# Patient Record
Sex: Female | Born: 1955 | Race: White | Hispanic: No | Marital: Married | Smoking: Never smoker
Health system: Southern US, Community
[De-identification: ages and names within clinical notes are randomized; demographics above are authoritative.]

## PROBLEM LIST (undated history)

## (undated) DIAGNOSIS — D649 Anemia, unspecified: Secondary | ICD-10-CM

## (undated) DIAGNOSIS — Z8619 Personal history of other infectious and parasitic diseases: Secondary | ICD-10-CM

## (undated) DIAGNOSIS — A389 Scarlet fever, uncomplicated: Secondary | ICD-10-CM

## (undated) DIAGNOSIS — N390 Urinary tract infection, site not specified: Secondary | ICD-10-CM

## (undated) DIAGNOSIS — N200 Calculus of kidney: Secondary | ICD-10-CM

## (undated) HISTORY — PX: COLONOSCOPY: SHX174

## (undated) HISTORY — DX: Calculus of kidney: N20.0

## (undated) HISTORY — DX: Personal history of other infectious and parasitic diseases: Z86.19

## (undated) HISTORY — DX: Anemia, unspecified: D64.9

## (undated) HISTORY — PX: TONSILLECTOMY: SUR1361

## (undated) HISTORY — PX: OTHER SURGICAL HISTORY: SHX169

## (undated) HISTORY — DX: Scarlet fever, uncomplicated: A38.9

## (undated) HISTORY — DX: Urinary tract infection, site not specified: N39.0

---

## 1997-10-08 ENCOUNTER — Ambulatory Visit (HOSPITAL_COMMUNITY): Admission: RE | Admit: 1997-10-08 | Discharge: 1997-10-08 | Payer: Self-pay | Admitting: Internal Medicine

## 1997-11-08 ENCOUNTER — Other Ambulatory Visit: Admission: RE | Admit: 1997-11-08 | Discharge: 1997-11-08 | Payer: Self-pay | Admitting: Obstetrics and Gynecology

## 1999-05-25 ENCOUNTER — Other Ambulatory Visit: Admission: RE | Admit: 1999-05-25 | Discharge: 1999-05-25 | Payer: Self-pay | Admitting: Obstetrics and Gynecology

## 2000-03-03 ENCOUNTER — Emergency Department (HOSPITAL_COMMUNITY): Admission: EM | Admit: 2000-03-03 | Discharge: 2000-03-04 | Payer: Self-pay | Admitting: Emergency Medicine

## 2000-03-08 ENCOUNTER — Emergency Department (HOSPITAL_COMMUNITY): Admission: EM | Admit: 2000-03-08 | Discharge: 2000-03-08 | Payer: Self-pay | Admitting: *Deleted

## 2000-05-24 ENCOUNTER — Other Ambulatory Visit: Admission: RE | Admit: 2000-05-24 | Discharge: 2000-05-24 | Payer: Self-pay | Admitting: Obstetrics and Gynecology

## 2001-06-02 ENCOUNTER — Other Ambulatory Visit: Admission: RE | Admit: 2001-06-02 | Discharge: 2001-06-02 | Payer: Self-pay | Admitting: Obstetrics and Gynecology

## 2002-03-20 ENCOUNTER — Encounter (INDEPENDENT_AMBULATORY_CARE_PROVIDER_SITE_OTHER): Payer: Self-pay | Admitting: *Deleted

## 2002-03-20 ENCOUNTER — Ambulatory Visit (HOSPITAL_COMMUNITY): Admission: RE | Admit: 2002-03-20 | Discharge: 2002-03-20 | Payer: Self-pay | Admitting: Obstetrics and Gynecology

## 2003-02-04 ENCOUNTER — Other Ambulatory Visit: Admission: RE | Admit: 2003-02-04 | Discharge: 2003-02-04 | Payer: Self-pay | Admitting: Obstetrics and Gynecology

## 2004-02-17 ENCOUNTER — Other Ambulatory Visit: Admission: RE | Admit: 2004-02-17 | Discharge: 2004-02-17 | Payer: Self-pay | Admitting: Obstetrics and Gynecology

## 2005-04-12 ENCOUNTER — Ambulatory Visit: Payer: Self-pay | Admitting: Internal Medicine

## 2005-04-12 ENCOUNTER — Ambulatory Visit: Payer: Self-pay | Admitting: Cardiology

## 2005-10-23 ENCOUNTER — Ambulatory Visit: Payer: Self-pay | Admitting: Internal Medicine

## 2006-10-25 ENCOUNTER — Encounter: Payer: Self-pay | Admitting: Internal Medicine

## 2006-11-05 ENCOUNTER — Ambulatory Visit: Payer: Self-pay | Admitting: Internal Medicine

## 2006-11-05 LAB — CONVERTED CEMR LAB
ALT: 22 units/L (ref 0–35)
AST: 24 units/L (ref 0–37)
Basophils Relative: 0.7 % (ref 0.0–1.0)
CO2: 32 meq/L (ref 19–32)
Calcium: 9.7 mg/dL (ref 8.4–10.5)
Chloride: 104 meq/L (ref 96–112)
Creatinine, Ser: 0.7 mg/dL (ref 0.4–1.2)
Crystals: NEGATIVE
GFR calc Af Amer: 114 mL/min
Glucose, Bld: 90 mg/dL (ref 70–99)
HCT: 40.6 % (ref 36.0–46.0)
Hemoglobin: 14.2 g/dL (ref 12.0–15.0)
Lymphocytes Relative: 31.3 % (ref 12.0–46.0)
MCV: 92.4 fL (ref 78.0–100.0)
Monocytes Absolute: 0.5 10*3/uL (ref 0.2–0.7)
Monocytes Relative: 8 % (ref 3.0–11.0)
Neutro Abs: 3.9 10*3/uL (ref 1.4–7.7)
Nitrite: NEGATIVE
Platelets: 324 10*3/uL (ref 150–400)
Potassium: 4.2 meq/L (ref 3.5–5.1)
RBC: 4.39 M/uL (ref 3.87–5.11)
RDW: 12.1 % (ref 11.5–14.6)
Sodium: 142 meq/L (ref 135–145)
Total Bilirubin: 0.8 mg/dL (ref 0.3–1.2)
Total CHOL/HDL Ratio: 3.1
Total Protein: 7.1 g/dL (ref 6.0–8.3)
Triglycerides: 63 mg/dL (ref 0–149)
Urine Glucose: NEGATIVE mg/dL
Urobilinogen, UA: 0.2 (ref 0.0–1.0)
VLDL: 13 mg/dL (ref 0–40)

## 2006-11-06 ENCOUNTER — Encounter: Payer: Self-pay | Admitting: Internal Medicine

## 2006-11-06 ENCOUNTER — Ambulatory Visit: Payer: Self-pay | Admitting: Internal Medicine

## 2006-11-25 ENCOUNTER — Ambulatory Visit: Payer: Self-pay | Admitting: Gastroenterology

## 2006-12-09 ENCOUNTER — Ambulatory Visit: Payer: Self-pay | Admitting: Gastroenterology

## 2006-12-09 ENCOUNTER — Encounter: Payer: Self-pay | Admitting: Internal Medicine

## 2007-03-21 ENCOUNTER — Encounter: Payer: Self-pay | Admitting: *Deleted

## 2007-03-21 DIAGNOSIS — Z9089 Acquired absence of other organs: Secondary | ICD-10-CM | POA: Insufficient documentation

## 2007-03-21 DIAGNOSIS — M26609 Unspecified temporomandibular joint disorder, unspecified side: Secondary | ICD-10-CM | POA: Insufficient documentation

## 2007-03-21 DIAGNOSIS — A389 Scarlet fever, uncomplicated: Secondary | ICD-10-CM | POA: Insufficient documentation

## 2007-03-21 DIAGNOSIS — G510 Bell's palsy: Secondary | ICD-10-CM | POA: Insufficient documentation

## 2007-03-21 DIAGNOSIS — D649 Anemia, unspecified: Secondary | ICD-10-CM | POA: Insufficient documentation

## 2007-03-21 HISTORY — DX: Anemia, unspecified: D64.9

## 2007-03-21 HISTORY — DX: Scarlet fever, uncomplicated: A38.9

## 2007-04-30 ENCOUNTER — Encounter: Admission: RE | Admit: 2007-04-30 | Discharge: 2007-04-30 | Payer: Self-pay | Admitting: Obstetrics and Gynecology

## 2008-05-10 ENCOUNTER — Encounter: Admission: RE | Admit: 2008-05-10 | Discharge: 2008-05-10 | Payer: Self-pay | Admitting: Obstetrics and Gynecology

## 2009-05-11 ENCOUNTER — Encounter: Admission: RE | Admit: 2009-05-11 | Discharge: 2009-05-11 | Payer: Self-pay | Admitting: Obstetrics and Gynecology

## 2009-07-22 ENCOUNTER — Ambulatory Visit: Payer: Self-pay | Admitting: Internal Medicine

## 2009-07-22 DIAGNOSIS — R030 Elevated blood-pressure reading, without diagnosis of hypertension: Secondary | ICD-10-CM | POA: Insufficient documentation

## 2009-08-03 ENCOUNTER — Telehealth (INDEPENDENT_AMBULATORY_CARE_PROVIDER_SITE_OTHER): Payer: Self-pay | Admitting: *Deleted

## 2009-08-04 ENCOUNTER — Telehealth (INDEPENDENT_AMBULATORY_CARE_PROVIDER_SITE_OTHER): Payer: Self-pay | Admitting: *Deleted

## 2009-08-08 ENCOUNTER — Encounter: Payer: Self-pay | Admitting: Internal Medicine

## 2009-08-08 ENCOUNTER — Encounter: Payer: Self-pay | Admitting: Cardiology

## 2009-08-08 ENCOUNTER — Ambulatory Visit: Payer: Self-pay | Admitting: Cardiology

## 2009-08-08 ENCOUNTER — Ambulatory Visit: Payer: Self-pay

## 2009-08-08 ENCOUNTER — Encounter (HOSPITAL_COMMUNITY)
Admission: RE | Admit: 2009-08-08 | Discharge: 2009-10-05 | Payer: Self-pay | Source: Home / Self Care | Admitting: Internal Medicine

## 2009-08-18 ENCOUNTER — Telehealth: Payer: Self-pay | Admitting: Internal Medicine

## 2009-08-23 ENCOUNTER — Telehealth: Payer: Self-pay | Admitting: Internal Medicine

## 2009-08-23 ENCOUNTER — Ambulatory Visit: Payer: Self-pay | Admitting: Internal Medicine

## 2009-09-20 ENCOUNTER — Ambulatory Visit: Payer: Self-pay | Admitting: Internal Medicine

## 2009-09-20 LAB — CONVERTED CEMR LAB
AST: 21 units/L (ref 0–37)
Albumin: 4.4 g/dL (ref 3.5–5.2)
BUN: 20 mg/dL (ref 6–23)
Basophils Relative: 0.6 % (ref 0.0–3.0)
CO2: 30 meq/L (ref 19–32)
Chloride: 106 meq/L (ref 96–112)
Cholesterol: 241 mg/dL — ABNORMAL HIGH (ref 0–200)
Direct LDL: 146.9 mg/dL
Eosinophils Relative: 2.6 % (ref 0.0–5.0)
GFR calc non Af Amer: 115.52 mL/min (ref >60)
Glucose, Bld: 83 mg/dL (ref 70–99)
HCT: 43.4 % (ref 36.0–46.0)
Lymphocytes Relative: 36.9 % (ref 12.0–46.0)
Lymphs Abs: 2.1 10*3/uL (ref 0.7–4.0)
Neutro Abs: 3 10*3/uL (ref 1.4–7.7)
Neutrophils Relative %: 51.1 % (ref 43.0–77.0)
Platelets: 254 10*3/uL (ref 150.0–400.0)
RBC: 4.56 M/uL (ref 3.87–5.11)
Total Bilirubin: 0.8 mg/dL (ref 0.3–1.2)
Total CHOL/HDL Ratio: 3
Triglycerides: 103 mg/dL (ref 0.0–149.0)

## 2009-09-22 ENCOUNTER — Ambulatory Visit: Payer: Self-pay | Admitting: Internal Medicine

## 2009-11-08 ENCOUNTER — Encounter: Payer: Self-pay | Admitting: Internal Medicine

## 2010-02-14 ENCOUNTER — Encounter: Payer: Self-pay | Admitting: Internal Medicine

## 2010-03-02 NOTE — Letter (Signed)
Summary: [facility]   Imported By: Sherian Rein 02/23/2010 10:49:30  _____________________________________________________________________  External Attachment:    Type:   Image     Comment:   External Document

## 2010-03-02 NOTE — Letter (Signed)
Summary: [facility]  [facility]   Imported By: Sherian Rein 11/22/2009 14:07:03  _____________________________________________________________________  External Attachment:    Type:   Image     Comment:   External Document

## 2010-03-02 NOTE — Assessment & Plan Note (Signed)
Summary: bp check/SD   Nurse Visit   Vital Signs:  Patient profile:   55 year old female BP sitting:   124 / 80  (left arm)  Vitals Entered By: Lamar Sprinkles, CMA (August 23, 2009 4:20 PM) CC: Pt concerned about elevated BP at local pharmacy today.  Comments Normal Bp in office today. Patient c/o tightness in her head this am and went to the local pharmacy. Symptoms are gone now. BP 155/89 at local pharmacy. Advised pt to continue to check BP daily and call office if symptoms returned. .....Marland KitchenMarland KitchenLamar Sprinkles, CMA  August 23, 2009 4:26 PM    Allergies: No Known Drug Allergies  Orders Added: 1)  Est. Patient Level I [16109]

## 2010-03-02 NOTE — Progress Notes (Signed)
Summary: Nuclear Pre-Procedure  Phone Note Outgoing Call Call back at [facility] Phone [phone]   Call placed by: Stanton Kidney, EMT-P,  August 04, 2009 11:05 AM Action Taken: Phone Call Completed Summary of Call: Left message with information on Myoview Information Sheet (see scanned document for details).     Nuclear Med Background Indications for Stress Test: Evaluation for Ischemia     Symptoms: Chest Pain, Chest Pressure    Nuclear Pre-Procedure Cardiac Risk Factors: Hypertension Height (in): 65

## 2010-03-02 NOTE — Progress Notes (Signed)
Summary: RESULTS  Phone Note Call from Patient Call back at [phone]   Summary of Call: Patient is requesting results of stress test.  Initial call taken by: Lamar Sprinkles, CMA,  August 18, 2009 1:55 PM  Follow-up for Phone Call        Normal stress test.  Follow-up by: Jacques Navy MD,  August 18, 2009 4:26 PM  Additional Follow-up for Phone Call Additional follow up Details #1::        Pt informed  Additional Follow-up by: Lamar Sprinkles, CMA,  August 18, 2009 4:45 PM

## 2010-03-02 NOTE — Assessment & Plan Note (Signed)
Summary: cpx/bcbs/#/cd   Vital Signs:  Patient profile:   55 year old female Height:      65 inches Weight:      140 pounds BMI:     23.38 O2 Sat:      97 % on Room air Temp:     98.1 degrees F oral Pulse rate:   79 / minute BP sitting:   120 / 72  (left arm) Cuff size:   regular  Vitals Entered By: Bill Salinas CMA (September 22, 2009 2:26 PM)  O2 Flow:  Room air CC: pt here for cpx/ ab  Vision Screening:      Vision Comments: Last eye exam was Aug 2011 with Dr Doylene Canard. Normal eye exam   Primary Care Provider:  Norins  CC:  pt here for cpx/ ab.  History of Present Illness: Patient presents for routine medical evaluation.She is current with gyn and mammography. She had a colonoscopy at 12 which was normal. She did have some GERD type symptoms and she did come to [facility] due to family history for risk - negative study. She is feeling good.   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 TONSILLECTOMY, HX OF (ICD-V45.79)     G4P4  Family History: Last updated: 2006-12-01 Father- died of sepsis, HTN, tobacco abuse, Lung cancer, esophageal cancer, liver cancer. 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, walker, good social support  Risk Factors: Alcohol Use: <1 (Dec 01, 2006) Caffeine Use: 3 (Dec 01, 2006)  Risk Factors: Smoking Status: never (12-01-2006) Passive Smoke Exposure: no (12/01/06)  Review of Systems  The patient denies anorexia, fever, weight loss, weight gain, vision loss, decreased hearing, hoarseness, chest pain, syncope, dyspnea on exertion, peripheral edema, prolonged cough, headaches, abdominal pain, severe indigestion/heartburn, incontinence, muscle weakness, suspicious skin lesions, difficulty walking, depression, unusual weight change, abnormal bleeding, enlarged lymph nodes, angioedema, and breast masses.    Physical Exam  General:  WNWD white female in no distress Head:  Normocephalic and atraumatic without obvious abnormalities. No apparent alopecia or balding. Eyes:  No corneal or conjunctival inflammation noted. EOMI. Perrla. Funduscopic exam benign, without hemorrhages, exudates or papilledema. Vision grossly normal. Ears:  External ear exam shows no significant lesions or  deformities.  Otoscopic examination reveals clear canals, tympanic membranes are intact bilaterally without bulging, retraction, inflammation or discharge. Hearing is grossly normal bilaterally. Nose:  no external deformity and no external erythema.   Mouth:  Oral mucosa and oropharynx without lesions or exudates.  Teeth in good repair. Neck:  supple, full ROM, no thyromegaly, and no carotid bruits.   Chest Wall:  No deformities, masses, or tenderness noted. Breasts:  deferred to gyn Lungs:  Normal respiratory effort, chest expands symmetrically. Lungs are clear to auscultation, no crackles or wheezes. Heart:  Normal rate and regular rhythm. S1 and S2 normal without gallop, murmur, click, rub or other extra sounds. Abdomen:  soft, non-tender, normal bowel sounds, no guarding, and no hepatomegaly.     Genitalia:  deferred to gyn Msk:  normal ROM, no joint swelling, and no redness over joints.   Pulses:  2+ radial and DP pulses Extremities:  No clubbing, cyanosis, edema, or deformity noted with normal full range of motion of all joints.   Neurologic:  alert & oriented X3, cranial nerves II-XII intact, strength normal in all extremities, gait normal, and DTRs symmetrical and normal.   Skin:  turgor normal, color normal, no rashes, and no suspicious lesions.   Cervical Nodes:  no anterior cervical adenopathy and no posterior cervical adenopathy.   Psych:  Oriented X3, memory intact for recent and remote, normally interactive, good eye contact, and not anxious appearing.     Impression & Recommendations:  Problem # 1:  Preventive Health Care (ICD-V70.0) Unremarkable history since last visit. Symptoms of dyspepsia have resolved. Had a negative nuclear stress test. Physical exam is normal and she is current with her gynecologist. She is current with mammography. She had a normal colonoscopy at age 47. Her labs are excellent except for mild elevation in LDL cholesterol at 146, but below NCEP threshold for medical treatment. She is to exercise greater descretion in her diet, i.e. low fat.  In summary - a delightful woman who is medically stable and healthy. She is advised to return for general physical exam with lab work in 3 years. Follow-up lipid panel in 6-12 months is suggested. She will otherwise return sooner as needed.   Patient: Bridget Elliott Note: All result statuses are Final unless otherwise noted.  Tests: (1) BMP (METABOL)   Sodium                    144 mEq/L                   135-145   Potassium                 4.7 mEq/L                   3.5-5.1   Chloride                  106 mEq/L                   96-112   Carbon Dioxide            30 mEq/L                    19-32   Glucose                   83 mg/dL                    13-08  BUN                       20 mg/dL                     1-61   Creatinine                0.6 mg/dL                   0.9-6.0   Calcium                   9.5 mg/dL                   4.5-40.9   GFR                       115.52 mL/min               >60  Tests: (2) Lipid Panel (LIPID)   Cholesterol          [H]  241 mg/dL                   8-119     ATP III Classification            Desirable:  < 200 mg/dL                    Borderline High:  200 - 239 mg/dL               High:  > = 240 mg/dL   Triglycerides             103.0 mg/dL                 1.4-782.9     Normal:  <150 mg/dL     Borderline High:  562 - 199 mg/dL   HDL                       13.08 mg/dL                 >65.78   VLDL Cholesterol          20.6 mg/dL                  4.6-96.2  CHO/HDL Ratio:  CHD Risk                             3                    Men          Women     1/2 Average Risk     3.4          3.3     Average Risk          5.0          4.4     2X Average Risk          9.6          7.1     3X Average Risk          15.0          11.0  Tests: (3) CBC Platelet w/Diff (CBCD)   White Cell Count          5.8 K/uL                    4.5-10.5   Red Cell Count            4.56 Mil/uL                 3.87-5.11   Hemoglobin                14.7 g/dL                   16.1-09.6   Hematocrit                43.4 %                      36.0-46.0   MCV                       95.1 fl                     78.0-100.0   MCHC                      33.8 g/dL                   04.5-40.9   RDW                       13.8 %                      11.5-14.6   Platelet Count            254.0 K/uL                  150.0-400.0   Neutrophil %              51.1 %                      43.0-77.0   Lymphocyte %              36.9 %                      12.0-46.0   Monocyte %                8.8 %                       3.0-12.0   Eosinophils%              2.6 %                       0.0-5.0   Basophils %               0.6 %                       0.0-3.0   Neutrophill  Absolute      3.0 K/uL                    1.4-7.7   Lymphocyte Absolute       2.1 K/uL  0.7-4.0   Monocyte Absolute         0.5 K/uL                    0.1-1.0  Eosinophils, Absolute                             0.1 K/uL                    0.0-0.7   Basophils Absolute        0.0 K/uL                    0.0-0.1  Tests: (4) Hepatic/Liver Function Panel (HEPATIC)   Total Bilirubin           0.8 mg/dL                   1.6-1.0   Direct Bilirubin          0.2 mg/dL                   9.6-0.4   Alkaline Phosphatase      72 U/L                      39-117   AST                       21 U/L                      0-37   ALT                       23 U/L                      0-35   Total Protein             6.8 g/dL                    5.4-0.9   Albumin                   4.4 g/dL                    8.1-1.9  Tests: (5) TSH (TSH)   FastTSH                   1.40 uIU/mL                 0.35-5.50  Tests: (6) Cholesterol LDL - Direct (DIRLDL)  Cholesterol LDL - Direct                             146.9 mg/dL     Optimal:  <147 mg/dL     Near or Above Optimal:  100-129 mg/dL     Borderline High:  829-562 mg/dL     High:  130-865 mg/dL     Very High:  >784 mg/dL  Preventive Care Screening  Mammogram:    Date:  04/29/2008    Results:  normal   Pap Smear:    Date:  04/29/2008    Results:  normal

## 2010-03-02 NOTE — Progress Notes (Signed)
----   Converted from flag ---- ---- 07/28/2009 4:36 PM, Edman Circle wrote: appt 7/11 @ 9:00 NPO 4 hours no smoking or caffeine 12 hours  ---- 07/28/2009 4:27 PM, Dagoberto Reef wrote: Thanks   ---- 07/22/2009 2:03 PM, Jacques Navy MD wrote: The following orders have been entered for this patient and placed on Admin Hold:  Type:     Referral       Code:   Cardiolite Description:   Cardiolite Order Date:   07/22/2009   Authorized By:   Jacques Navy MD Order #:   [id number] Clinical Notes:   Special Instructions: typical substernal pressure discomfort; positive family history - mother with heart disease early; post-menoapausal; new HTN ------------------------------

## 2010-03-02 NOTE — Assessment & Plan Note (Signed)
Summary: headaches x1wk/nausea/cd   Vital Signs:  Patient profile:   55 year old female Height:      65 inches Weight:      139 pounds BMI:     23.21 O2 Sat:      97 % on Room air Temp:     97.6 degrees F oral Pulse rate:   76 / minute BP sitting:   136 / 98  (left arm) Cuff size:   regular  Vitals Entered By: Bill Salinas CMA (July 22, 2009 1:29 PM)  O2 Flow:  Room air CC: pt here with c/o headaches x 1 week,  she has also been having lower abd pain and chest discomfort with belching/ ab   Primary Care Provider:  Norins  CC:  pt here with c/o headaches x 1 week and she has also been having lower abd pain and chest discomfort with belching/ ab.  History of Present Illness: "Feeling like crap." Headache, substernal pressure like discomfort, supra-pubic pressure discomfort. She saw Dr. Marcelle Overlie 1 month aqo - negative U/A, negative sonogram.  She reports that she has substernal pressure like discomfort that is not exertionally related. She has no radiation of pain, no diaphoresis, +/- dyspnea on exertion.  Current Medications (verified): 1)  Flonase 50 Mcg/act  Susp (Fluticasone Propionate) .... As Needed 2)  Claritin 10 Mg  Tabs (Loratadine) .... 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 TONSILLECTOMY, HX OF (ICD-V45.79)     G4P4  Family History: Last updated: 11/13/06 Father- died of  sepsis, HTN, tobacco abuse, Lung cancer, esophageal cancer, liver cancer. 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  The patient denies anorexia, fever, weight loss, weight gain, decreased hearing, hoarseness, syncope, prolonged cough, severe indigestion/heartburn, muscle weakness, difficulty walking, depression, abnormal bleeding, enlarged lymph nodes, and angioedema.    Physical Exam  General:  WNWD white female in no distress Head:  Normocephalic and atraumatic without  obvious abnormalities. No apparent alopecia or balding. Eyes:  C&S clear Lungs:  normal respiratory effort and normal breath sounds.   Heart:  normal rate, regular rhythm, no murmur, no gallop, and no JVD.   Abdomen:  soft, non-tender, normal bowel sounds, no distention, no guarding, no rigidity, and no hepatomegaly.   Msk:  normal ROM and no joint tenderness.   Pulses:  2+ radial pulses Neurologic:  alert & oriented X3, cranial nerves II-XII intact, and gait normal.   Skin:  turgor normal and color normal.   Psych:  Oriented X3, memory intact for recent and remote, normally interactive, and good eye contact.     Impression & Recommendations:  Problem # 1:  CHEST PAIN, SUBSTERNAL (ICD-786.59) Patient with recurrent substernal chest pressure. Risk factors: post-menopausal, HTN, postiive family history - mother with heart disease early.  Plan -  risk stratification - cardiolite study to r/o CAD.            start PPI therapy for possible GI etiology with plan to taper to H2 blocker therapy.  Orders: Cardiolite (Cardiolite)  Problem # 2:  ELEVATED BP READING WITHOUT DX HYPERTENSION (ICD-796.2) BP high today. No symptoms except recent h/o HA  Plan - monitor BP at home. Call for SBP running greater than 130s, DBP 90 or above.  Problem # 3:  TENSION HEADACHE (ICD-307.81) Patient describes several episodes of fronto-occiptal vice-like headaches that lasted several hours. She denies any associated symptoms, i.e. N/V, photophobia, paresthesia, prodrome. Picture c/w tension headache.  PLan - use of aleve as needed.   Complete Medication List: 1)  Flonase 50 Mcg/act Susp (Fluticasone propionate) .... As needed 2)  Claritin 10 Mg Tabs (Loratadine) .... Take one tablet daily as needed  Patient Instructions: 1)  substernal chest pressure - it is important to rule out cardiac disease first. Plan - schedule for a cardiac stress test. Other cause - increased acidity and irritation or reflux. Plan - PPI therapy, aciphex every AM for 9 days - if this makes a difference go to Prilosec OTC 2 capsules every AM for a full month of treatment. Then, switch to H2 blocker therapy (zantac,pepcid, etc - get the generic) daily for another week or two and then as needed.  2)  Blood pressure  136/98 today. Check BP at random. If systolic is high 161'W or above, if diastolic is 90 or above let me know.

## 2010-03-02 NOTE — Progress Notes (Signed)
  Phone Note Call from Patient   Summary of Call: Pt checked bp at pharmacy. BP today 155/89. She was advised to call office if this happened.  Initial call taken by: Lamar Sprinkles, CMA,  August 23, 2009 12:10 PM  Follow-up for Phone Call        Spoke w/pt. She c/o "feeling bad" today, slight head pressure, No pain. Scheduled for nurse visit for BP med Follow-up by: Lamar Sprinkles, CMA,  August 23, 2009 1:30 PM     Appended Document:  N/v for BP check not med

## 2010-03-02 NOTE — Assessment & Plan Note (Signed)
Summary: Cardiology Nuclear Study  Nuclear Med Background Indications for Stress Test: Evaluation for Ischemia     Symptoms: Chest Pressure, Chest Tightness    Nuclear Pre-Procedure Cardiac Risk Factors: Hypertension Caffeine/Decaff Intake: None NPO After: 9:30 PM Lungs: lungs IV 0.9% NS with Angio Cath: 22g     IV Site: (R) lower forearm IV Started by: Irean Hong RN Chest Size (in) 34     Cup Size D     Height (in): 65 Weight (lb): 137 BMI: 22.88  Nuclear Med Study 1 or 2 day study:  1 day     Stress Test Type:  Stress Reading MD:  Marca Ancona, MD     Referring MD:  Judie Petit.Norins Resting Radionuclide:  Technetium 80m Tetrofosmin     Resting Radionuclide Dose:  11 mCi  Stress Radionuclide:  Technetium 59m Tetrofosmin     Stress Radionuclide Dose:  33 mCi   Stress Protocol Exercise Time (min):  8:30 min     Max HR:  192 bpm     Predicted Max HR:  167 bpm  Max Systolic BP: 177 mm Hg     Percent Max HR:  114.97 %     METS: 10.10 Rate Pressure Product:  45409    Stress Test Technologist:  Milana Na EMT-P     Nuclear Technologist:  Domenic Polite CNMT  Rest Procedure  Myocardial perfusion imaging was performed at rest 45 minutes following the intravenous administration of Myoview Technetium 49m Tetrofosmin.  Stress Procedure  The patient exercised for 8:30. The patient stopped due to fatigue and denied any chest pain.  There were no significant ST-T wave changes.  Myoview was injected at peak exercise and myocardial perfusion imaging was performed after a brief delay.  QPS Raw Data Images:  Normal; no motion artifact; normal heart/lung ratio. Stress Images:  NI: Uniform and normal uptake of tracer in all myocardial segments. Rest Images:  Normal homogeneous uptake in all areas of the myocardium. Subtraction (SDS):  There is no evidence of scar or ischemia. Transient Ischemic Dilatation:  1.04  (Normal <1.22)  Lung/Heart Ratio:  .29  (Normal <0.45)  Quantitative  Gated Spect Images QGS EDV:  71 ml QGS ESV:  27 ml QGS EF:  62 % QGS cine images:  Normal wall motion.    Overall Impression  Exercise Capacity: Good exercise capacity. BP Response: Normal blood pressure response. Clinical Symptoms: Fatigue, no chest pain.  ECG Impression: Insignificant upsloping ST segment depression. Overall Impression: Normal stress nuclear study.

## 2010-03-06 ENCOUNTER — Other Ambulatory Visit: Payer: Self-pay

## 2010-03-07 ENCOUNTER — Encounter: Payer: Self-pay | Admitting: Internal Medicine

## 2010-04-06 ENCOUNTER — Telehealth: Payer: Self-pay | Admitting: Internal Medicine

## 2010-04-06 ENCOUNTER — Ambulatory Visit (INDEPENDENT_AMBULATORY_CARE_PROVIDER_SITE_OTHER): Payer: BC Managed Care – PPO | Admitting: Internal Medicine

## 2010-04-06 ENCOUNTER — Encounter: Payer: Self-pay | Admitting: Internal Medicine

## 2010-04-06 DIAGNOSIS — H8309 Labyrinthitis, unspecified ear: Secondary | ICD-10-CM

## 2010-04-06 NOTE — Letter (Signed)
Summary: [facility]   Imported By: Sherian Rein 03/30/2010 10:54:45  _____________________________________________________________________  External Attachment:    Type:   Image     Comment:   External Document

## 2010-04-07 ENCOUNTER — Telehealth: Payer: Self-pay | Admitting: Internal Medicine

## 2010-04-11 NOTE — Assessment & Plan Note (Signed)
Summary: DIZZINESS/  4:15 APPT /NWS   Vital Signs:  Patient profile:   55 year old female Height:      65 inches Weight:      140 pounds BMI:     23.38 O2 Sat:      98 % on Room air Temp:     97.9 degrees F oral Pulse rate:   83 / minute BP sitting:   158 / 98  (left arm) Cuff size:   regular  Vitals Entered By: Bill Salinas CMA (April 06, 2010 5:12 PM)  O2 Flow:  Room air  Primary Care Provider:  Igor Bishop   History of Present Illness: Bridget Elliott presents acutely for dizziness with nausea and vomiting.  She reports that she felt a little ill last night. This morning she awoke and the bed was spinning and she was very dizzy. she was able to get up and about but felt very off balance. Through the day she was mostly bedbound due to her symptoms. Through the day she felt worse but clearly describes feeling off balance. she denies double vision, focal weakness or paresthesia, headache, dificulty with speech or thinking or other neurolgoic symptoms. She has had periods of feeling cold anc clammy. during the exam she developed tingling in the hands and feet and a shakiness that is similar to a post-operative feeling.   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 TONSILLECTOMY, HX OF (ICD-V45.79)     G4P4  Family History: Last updated: 11-24-06 Father- died of sepsis, HTN, tobacco abuse,  Lung cancer, esophageal cancer, liver cancer. 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  Physical Exam  General:  WNWD white woman who is clammy to touch Head:  normocephalic and atraumatic.   Eyes:  vision grossly intact, pupils equal, pupils round, pupils reactive to light, corneas and lenses clear, no iris abnormalities, and no optic disk abnormalities.  She does have very subtle horizontal nystagmus Ears:  External ear  exam shows no significant lesions or deformities.  Otoscopic examination reveals clear canals, tympanic membranes are intact bilaterally without bulging, retraction, inflammation or discharge. Hearing is grossly normal bilaterally. Neck:  supple and full ROM.   Lungs:  normal respiratory effort and normal breath sounds.   Heart:  normal rate, regular rhythm, no murmur, and no JVD.   Abdomen:  soft, non-tender, and normal bowel sounds.   Msk:  normal ROM, no joint tenderness, no joint swelling, and no joint warmth.   Pulses:  2+ radial Extremities:  No clubbing, cyanosis, edema, or deformity noted with normal full range of motion of all joints.   Neurologic:  alert & oriented X3, cranial nerves II-XII intact, strength normal in all extremities, and DTRs symmetrical and normal.   Skin:  turgor normal.  Pale and clammy Cervical Nodes:  no anterior cervical adenopathy and no posterior cervical adenopathy.     Psych:  anxious, worried.   Impression & Recommendations:  Problem # 1:  LABYRINTHITIS, ACUTE (ICD-386.30) syptoms and exam c/w acute labyrinthitis with N/V and anxiety with vaso-vagal symptoms. No findings to suggest CNS event.  Plan - meclizine 25mg  q6 until symptoms better then 12.6 q 6 as needed           valium 5 mg q 6 x 2 doses then q 6 as needed           educated to signs and symptoms of CNS event with instructions to go to [facility] for Code Stroke should such develop.  Complete Medication List: 1)  Diazepam 5 Mg Tabs (Diazepam) .Marland Kitchen.. 1 by mouth every 6 hours x 2 then every 6 hours as needed for anxiety with dizziness  Patient Instructions: 1)  Severe dizziness - most likely inner ear, labyrinthitis, which gives dysequilibirium - like being seasick. No signs or findings to suggest a stroke-like event. Plan - meclizine 25mg  every 6 hours hours until symptoms are better then take 1/2 tablet every 6 hours as needed; valium 5 mg every 6 hours for 2 or 3 doses then as needed. 2)  For any major change in symptoms: double vision, isolated weakness, loss of feeling, change in thinking - go to [facility] ED as tell them you are there for a code stroke.  Prescriptions: DIAZEPAM 5 MG TABS (DIAZEPAM) 1 by mouth every 6 hours x 2 then every 6 hours as needed for anxiety with dizziness  #25 x 1   Entered and Authorized by:   Jacques Navy MD   Signed by:   Jacques Navy MD on 04/06/2010   Method used:   Telephoned to ...       [facility]  [phone]* (retail)       [address] [place], [place]  [address]       Ph: [phone] or [phone]       Fax: [phone]   RxID:   [id number]    Orders Added: 1)  Est. Patient Level IV [02542]

## 2010-04-11 NOTE — Progress Notes (Signed)
Summary: WORK IN APT?   Phone Note Call from Patient   Summary of Call: Pt woke up this am w/dizzyness when she moves her head. Patient is requesting work in apt today w/MD. Initial call taken by: Lamar Sprinkles, CMA,  April 06, 2010 2:53 PM  Follow-up for Phone Call        seen today Follow-up by: Jacques Navy MD,  April 06, 2010 6:23 PM

## 2010-04-11 NOTE — Progress Notes (Signed)
  Phone Note Call from Patient   Summary of Call: pt states she feels much better than yesterday. She has been in the bed all day but states overall she feels much better. She has had no nausea or vomitting. FYI Initial call taken by: Ami Bullins CMA,  April 07, 2010 4:49 PM  Follow-up for Phone Call        thanks Follow-up by: Jacques Navy MD,  April 07, 2010 6:05 PM

## 2010-05-10 ENCOUNTER — Other Ambulatory Visit: Payer: Self-pay | Admitting: Obstetrics and Gynecology

## 2010-05-10 DIAGNOSIS — Z1231 Encounter for screening mammogram for malignant neoplasm of breast: Secondary | ICD-10-CM

## 2010-05-18 ENCOUNTER — Ambulatory Visit
Admission: RE | Admit: 2010-05-18 | Discharge: 2010-05-18 | Disposition: A | Discharge status: Home / Self Care | Payer: BC Managed Care – PPO | Source: Ambulatory Visit | Attending: Obstetrics and Gynecology | Admitting: Obstetrics and Gynecology

## 2010-05-18 DIAGNOSIS — Z1231 Encounter for screening mammogram for malignant neoplasm of breast: Secondary | ICD-10-CM

## 2010-06-16 NOTE — H&P (Signed)
   NAME:  Bridget Elliott, Bridget Elliott                          ACCOUNT NO.:  [id number]   MEDICAL RECORD NO.:  [id number]                   PATIENT TYPE:  AMB   LOCATION:                                       FACILITY:  [facility]   PHYSICIAN:  Duke Salvia. Marcelle Overlie, M.D.            DATE OF BIRTH:  05-16-1955   DATE OF ADMISSION:  03/20/2002  DATE OF DISCHARGE:                                HISTORY & PHYSICAL   CHIEF COMPLAINT:  Abnormal uterine bleeding, small leiomyoma.   HISTORY OF PRESENT ILLNESS:  A 55 year old G-4, P-4.  Her husband has had a  vasectomy.  She has continued to have problems with heavy bleeding.  A  recent histogram in our office showed a normal cavity with uterus 10 x 6 x  4.7 with two small fibroids that were intramural or serosal in location.  She now presents now for a D&C, hysteroscopy, and cryoablation.  This  procedure, including the risks of bleeding, infection, and other  complications which may require open and additional surgery were all  discussed.  The 80% hypomenorrhea rate and 40% amenorrhea rate post  cryosurgery were reviewed with her which she understands and accepts.   ALLERGIES:  None.   REVIEW OF SYSTEMS:  Significant for anemia.  Her latest hemoglobin was 11.1.  That was on iron.   PAST SURGICAL HISTORY:  None.   CURRENT MEDICATIONS:  Vitamins and iron only.   OBSTETRICAL HISTORY:  Four vaginal deliveries at term without complication.   PHYSICAL EXAMINATION:  VITAL SIGNS: Temperature 98.2, blood pressure 120/72.  HEENT: Unremarkable.  NECK: Supple without masses.  CARDIOVASCULAR: Regular rate and rhythm without murmurs, rubs, or gallops.  BREASTS: Without masses.  ABDOMEN: Soft, flat, and nontender.  PELVIC: Normal external genitalia.  Vagina clear.  Uterus is mid to  posterior.  Normal signs, adnexa negative.  EXTREMITIES: Unremarkable.  NEUROLOGIC: Unremarkable.   IMPRESSION:  1. Small leiomyoma, normal sono histogram.  2. Menorrhagia.   PLAN:   D&C, hysteroscopy, cryoablation.  The procedure and risks reviewed as  above.                                              Richard M. Marcelle Overlie, M.D.     RMH/MEDQ  D:  03/20/2002  T:  03/20/2002  Job:  [id number]

## 2010-06-16 NOTE — Assessment & Plan Note (Signed)
Bridget Elliott                             PRIMARY CARE OFFICE NOTE   Bridget Elliott, Bridget Elliott                    MRN:          [id number]  DATE:10/23/2005                            DOB:          11-Nov-1955    HISTORY:  Bridget Elliott is a 55 year old woman followed for general health  care who does have a history of Bell's palsy, anemia and temporomandibular  joint syndrome.  She was last seen April 12, 2005 because of sudden onset of  heavy substernal chest pressure, like a basketball, with some dyspnea,  cough.  The patient at that time was sent for urgent CT scan to rule out a  pulmonary embolus, which was a normal study.   The patient presents today reporting that at her dentist's office she was  noted to have significant elevation in her systolic blood pressure.  This is  unusual for her.  In addition, she has had a bifrontal headache which she  describes as dull and pressure-like.  She will have occasional discomfort in  the occipital region of her head.  She does get relief with ibuprofen for  this.   The patient reports she has had episodes of chest pressure intermittently  over the last two weeks but no shortness of breath, no nausea, vomiting,  diaphoresis.  She has had no decreased exercise tolerance.  These episodes  are random and last for approximately one to two minutes.  The patient  frankly denies any significant social stressors or anxiety.   MEDICATIONS:  1. Flonase p.r.n..  2. Claritin p.r.n.; otherwise no regular medications.   REVIEW OF SYSTEMS:  Negative for fever, sweats or chills. CARDIOVASCULAR:  Per history of present illness but no palpitations, no exertional  discomfort, no decreased exercise tolerance.  No respiratory or  gastrointestinal problems are noted.   LIMITED PHYSICAL EXAMINATION:  VITAL SIGNS:  Temperature 98.6, blood  pressure 132/85, pulse 79, weight 128.  GENERAL APPEARANCE:  A well-nourished woman in no  acute distress.  HEENT:  Normocephalic, atraumatic.  Patient had no palpable temporal artery  and no tenderness in the area of the temporal artery.  CHEST:  Was clear with no rales, wheezes or rhonchi.  CARDIOVASCULAR:  2+ radial pulse.  She had a regular rate and rhythm without  murmurs, rubs or gallops.  No further examination conducted.   ASSESSMENT/PLAN:  1. Hypertension.  The patient's blood pressure at 132/85 is in the pre      hypertensive range.  In reviewing the patient's chart, she generally      runs in the 120's/80's with her last visit April 12, 2005 at 124/87,      November 26, 2003 at 120/76.  The patient is to monitor her blood      pressures at home over the next week or two.  She is to notify me if      her systolic pressures run greater than 130 at which point I would      consider her a candidate for possible medical intervention.  2. Chest discomfort.  The patient's chest discomfort is very  atypical for      angina given that it is spontaneous, not related to exercise, brief in      duration, with no associated symptoms.  The patient's cardiac risk      factors are low with patient having weakly positive family history      with cardiac disease in women who are older.  She is not a smoker, not      diabetic, has normal lipid levels, exercises on a regular basis.  The      patient's last electrocardiogram from December 22, 2002 was normal and      her examination today was normal.  I feel this is non-cardiac      discomfort.  The patient is to pay close attention to her symptoms      keeping a diary. She is to note if there is any associated symptoms      such as shortness of breath, diaphoresis or radiation of discomfort.      She is to pay particular attention to whether she is having any      decreased exercise tolerance.  For any of these symptoms, the patient      is to notify me and she would then be referred for a nuclear stress      study.  3. Headache.   Patient with bitemporal headache very suggestive of muscle      tension-type headache.  The patient's neurological examination is      grossly nonfocal.  Patient is to continue to use nonsteroidal anti-      inflammatory medication for her headache and if they continue to      persist I would consider referral to either Dr. Clarisse Gouge or Dr. Meryl Crutch      for neurology/headache specialist consult.                                   Bridget Gess Norins, MD   MEN/MedQ  DD:  10/24/2005  DT:  10/25/2005  Job #:  [id number]   cc:   Bing Matter

## 2010-06-16 NOTE — Op Note (Signed)
   [name], Bridget Elliott                     ACCOUNT NO.:  [id number]   MEDICAL RECORD NO.:  [id number]                   PATIENT TYPE:  AMB   LOCATION:  [facility]                                  FACILITY:  [facility]   PHYSICIAN:  Duke Salvia. Marcelle Overlie, M.D.            DATE OF BIRTH:  July 04, 1955   DATE OF PROCEDURE:  03/20/2002  DATE OF DISCHARGE:                                 OPERATIVE REPORT   PREOPERATIVE DIAGNOSES:  1. Abnormal uterine bleeding.  2. Menorrhagia.   POSTOPERATIVE DIAGNOSES:  1. Abnormal uterine bleeding.  2. Menorrhagia.   PROCEDURES:  1. Dilatation and curettage hysteroscopy.  2. Cryoablation.   SURGEON:  Duke Salvia. Marcelle Overlie, M.D.   ANESTHESIA:  MAC plus paracervical block.   PROCEDURE AND FINDINGS:  The patient was taken to the operating room.  After  an adequate level of sedation was obtained, the patient's legs were placed  in the stirrups.  The perineum and vagina were prepped and draped with  Betadine.  The bladder was drained.  EUA carried out.  The uterus was noted  in the posterior, normal size.  Mobile adnexa, negative.  A speculum was  positioned.  The cervix grasped with a tenaculum.  A paracervical block  created by infiltrating it at 9 o'clock submucosally, 5-7 mL of 1% Xylocaine  on either side, negative for aspiration.  The uterus was then sounded to 8  and 9 cm, was progressively dilated to a 31-32 Pratt dilator.  The 7-mm  diagnostic hysteroscope with continuous flow was then inserted.  A moderate  amount of some excessive endometrium was noted.  D&C was carried out,  minimal tissue was removed.  This was sent to pathology.  The scope was  reinserted.  The cavity was noted to be clear.  There was no evidence of any  residual polyps or fibroids.  After this was noted, the cryo probe was  placed into the left cornua at the appropriate depth.  A six minute cryo  sequence was performed.  After this was followed for five minutes, it was  repositioned  in the right cornual area at the appropriate depth and a follow  up six minute cryo sequence was performed without complications.  She  tolerated this well, went to the recovery room in good condition.                                               Richard M. Marcelle Overlie, M.D.    RMH/MEDQ  D:  03/20/2002  T:  03/20/2002  Job:  [id number]

## 2011-04-20 ENCOUNTER — Other Ambulatory Visit: Payer: Self-pay | Admitting: Obstetrics and Gynecology

## 2011-04-20 DIAGNOSIS — Z1231 Encounter for screening mammogram for malignant neoplasm of breast: Secondary | ICD-10-CM

## 2011-06-07 ENCOUNTER — Ambulatory Visit
Admission: RE | Admit: 2011-06-07 | Discharge: 2011-06-07 | Disposition: A | Discharge status: Home / Self Care | Payer: BC Managed Care – PPO | Source: Ambulatory Visit | Attending: Obstetrics and Gynecology | Admitting: Obstetrics and Gynecology

## 2011-06-07 DIAGNOSIS — Z1231 Encounter for screening mammogram for malignant neoplasm of breast: Secondary | ICD-10-CM

## 2011-10-22 ENCOUNTER — Encounter: Payer: Self-pay | Admitting: Internal Medicine

## 2011-10-22 ENCOUNTER — Telehealth: Payer: Self-pay | Admitting: Internal Medicine

## 2011-10-22 ENCOUNTER — Ambulatory Visit (INDEPENDENT_AMBULATORY_CARE_PROVIDER_SITE_OTHER): Payer: BC Managed Care – PPO | Admitting: Internal Medicine

## 2011-10-22 VITALS — BP 112/80 | HR 91 | Temp 98.0°F | Resp 16 | Wt 139.0 lb

## 2011-10-22 DIAGNOSIS — N390 Urinary tract infection, site not specified: Secondary | ICD-10-CM

## 2011-10-22 HISTORY — DX: Urinary tract infection, site not specified: N39.0

## 2011-10-22 LAB — POCT URINALYSIS DIPSTICK
Bilirubin, UA: NEGATIVE
Ketones, UA: NEGATIVE
Protein, UA: NEGATIVE
Spec Grav, UA: <=1.005
pH, UA: 5

## 2011-10-22 MED ORDER — SULFAMETHOXAZOLE-TRIMETHOPRIM 800-160 MG PO TABS: 1.0000 | ORAL_TABLET | Freq: Two times a day (BID) | ORAL | Status: AC

## 2011-10-22 NOTE — Telephone Encounter (Signed)
Come by lab now for U/A- doesn't need to wait. Will call with results and treatment if needed. [phone]

## 2011-10-22 NOTE — Telephone Encounter (Signed)
Caller: Aariel/Patient; Patient Name: Bridget Elliott; PCP: Illene Regulus (Adults only); Best Callback Phone Number: [phone]  Started with urinary symptoms on Saturday 9/21 - frequency, urgency, burning when urinating and nausea.  Seeing blood in urine especially when wipes or urinates on white paper.  Afebrile.  Triaged in Urinary Symptoms and Bloody Urine - Disposition:  See Provider Within 4 Hours due to urinary symptoms and genital pain with urination.  Appointment not available - transferred to office.

## 2011-10-23 NOTE — Telephone Encounter (Signed)
Pt states that she made an appt for 3 PM 09/23 with MEN and was seen. Pt unhappy that she made several attempt to contact office for follow up and eventually told to make an appt although MEN said she she did not. I apologized for the slower than usual call back.

## 2011-10-23 NOTE — Progress Notes (Signed)
  Subjective:    Patient ID: Bridget Elliott, female    DOB: 1955/06/05, 56 y.o.   MRN: [id number]  HPI Bridget Elliott presents for evaluation of increased urinary frequency, dysuria, post-micturition bladder spasm and hematuria. She has had no fever, chills, flank pain, N/V  PMH, FamHx and SocHx reviewed for any changes and relevance.  Takes no medications   Review of Systems System review is negative for any constitutional, cardiac, pulmonary, GI or neuro symptoms or complaints other than as described in the HPI.     Objective:   Physical Exam Filed Vitals:   10/22/11 1601  BP: 112/80  Pulse: 91  Temp: 98 F (36.7 C)  Resp: 16   Gen'l- WNWD white woman in no distress HEENT - C&S clear Abd- no guarding no tenderness       Assessment & Plan:  UTI - classical symptoms for cystitis. UA dip positive for blood and LE  Plan - TMP/SMX bid x 5 days

## 2012-07-03 ENCOUNTER — Other Ambulatory Visit: Payer: Self-pay

## 2012-07-03 DIAGNOSIS — Z1231 Encounter for screening mammogram for malignant neoplasm of breast: Secondary | ICD-10-CM

## 2012-09-15 ENCOUNTER — Ambulatory Visit
Admission: RE | Admit: 2012-09-15 | Discharge: 2012-09-15 | Disposition: A | Discharge status: Home / Self Care | Payer: BC Managed Care – PPO | Source: Ambulatory Visit

## 2012-09-15 DIAGNOSIS — Z1231 Encounter for screening mammogram for malignant neoplasm of breast: Secondary | ICD-10-CM

## 2014-01-13 ENCOUNTER — Other Ambulatory Visit: Payer: Self-pay | Admitting: Dermatology

## 2014-01-20 ENCOUNTER — Other Ambulatory Visit: Payer: Self-pay

## 2014-01-20 DIAGNOSIS — Z1231 Encounter for screening mammogram for malignant neoplasm of breast: Secondary | ICD-10-CM

## 2014-03-02 ENCOUNTER — Telehealth: Payer: Self-pay | Admitting: Internal Medicine

## 2014-03-02 NOTE — Telephone Encounter (Signed)
Pt used to be a pt of dr Linda Hedges. Would like to know if you will accept her as a pt? Pt states her daughter sees you.

## 2014-03-11 NOTE — Telephone Encounter (Signed)
[place] but need 30 minute appt and new patient paper work filled out before visit  No data in the [employer]  .

## 2014-03-12 NOTE — Telephone Encounter (Signed)
Pt has been scheduled.  °

## 2014-03-16 ENCOUNTER — Ambulatory Visit
Admission: RE | Admit: 2014-03-16 | Discharge: 2014-03-16 | Disposition: A | Discharge status: Home / Self Care | Payer: BLUE CROSS/BLUE SHIELD | Source: Ambulatory Visit

## 2014-03-16 ENCOUNTER — Encounter (INDEPENDENT_AMBULATORY_CARE_PROVIDER_SITE_OTHER): Payer: Self-pay

## 2014-03-16 DIAGNOSIS — Z1231 Encounter for screening mammogram for malignant neoplasm of breast: Secondary | ICD-10-CM

## 2014-03-31 ENCOUNTER — Encounter: Payer: Self-pay | Admitting: Internal Medicine

## 2014-03-31 ENCOUNTER — Ambulatory Visit (INDEPENDENT_AMBULATORY_CARE_PROVIDER_SITE_OTHER): Payer: BLUE CROSS/BLUE SHIELD | Admitting: Internal Medicine

## 2014-03-31 VITALS — BP 148/90 | Temp 98.6°F | Ht 64.5 in | Wt 143.6 lb

## 2014-03-31 DIAGNOSIS — J328 Other chronic sinusitis: Secondary | ICD-10-CM

## 2014-03-31 DIAGNOSIS — IMO0001 Reserved for inherently not codable concepts without codable children: Secondary | ICD-10-CM

## 2014-03-31 DIAGNOSIS — R030 Elevated blood-pressure reading, without diagnosis of hypertension: Secondary | ICD-10-CM

## 2014-03-31 MED ORDER — LISINOPRIL 10 MG PO TABS: 10.0000 mg | ORAL_TABLET | Freq: Every day | ORAL | Status: DC

## 2014-03-31 NOTE — Progress Notes (Signed)
Pre visit review using our clinic review tool, if applicable. No additional management support is needed unless otherwise documented below in the visit note.  Chief Complaint  Patient presents with  . Establish Care    Complains of pressure in her head and dizziness.  Also has pressure in her ear.  Has had 3 nose bleeds in the last month out of her left nostril only.    HPI: Patient  Bridget Elliott  59 y.o.  Homemaker with BS degree comes in today for  New transfer care  Health Care visit  Last ov was dr Linda Hedges for uti 2.5 years ago and  No acitve notes in EHR.  Other care provders : dr Matthew Saras and dr Delman Cheadle derm  Co of headache congestion without fever and recent let bloody dc  . No vomiting diarrhea Jan 7 and episode  of dizziness in bed and nosebleed ok when sat up.  Remote hx of  Vertigo  and labrythitis  Went away with sleep .  In last weeks head is tight    decongestants  Mom: ht  Father lung cancer  decceased   Sibling    Sis on ht  meds .   Health Maintenance  Topic Date Due  . HIV Screening  07/29/1971  . TETANUS/TDAP  07/29/1975  . PAP SMEAR  04/30/2011  . INFLUENZA VACCINE  04/29/2014 (Originally 08/29/2013)  . MAMMOGRAM  03/16/2016  . COLONOSCOPY  12/08/2016   Health Maintenance Review LIFESTYLE:  Exercise:  yes Tobacco/ETS:no Alcohol: per day ocass Sleep:7 hours Drug use: no Colonoscopy: 2008 PAP: 5 15  MAMMO: 2 16    ROS:  GEN/ HEENT: No fever, significant weight changes sweats headaches vision problems hearing changes,  Except above  CV/ PULM; No chest pain shortness of breath cough, syncope,edema  change in exercise tolerance. GI /GU: No adominal pain, vomiting, change in bowel habits. No blood in the stool. No significant GU symptoms. SKIN/HEME: ,no acute skin rashes suspicious lesions or bleeding. No lymphadenopathy, nodules, masses.  NEURO/ PSYCH:  No neurologic signs such as weakness numbness. No depression anxiety. IMM/ Allergy: No unusual  infections.  Allergy .   REST of 12 system review negative except as per HPI   Past Medical History  Diagnosis Date  . Renal stone     hx of removal and lithotrypsy   . Hx of varicella     No past surgical history on file.  Family History  Problem Relation Age of Onset  . Cancer Mother   . Hypertension Mother   . Hyperlipidemia Mother   . Cancer Father     Lung,esophageal  . Heart attack Maternal Grandfather   . Heart attack Paternal Grandfather     History   Social History  . Marital Status: Married    Spouse Name: N/A  . Number of Children: N/A  . Years of Education: N/A   Social History Main Topics  . Smoking status: Never Smoker   . Smokeless tobacco: Not on file  . Alcohol Use: 0.0 oz/week    0 Standard drinks or equivalent per week     Comment: 5 glasses of wine a week  . Drug Use: No  . Sexual Activity:    Partners: Male   Other Topics Concern  . None   Social History Narrative   7 hours of sleep per night   Does not work outside the home   Lives alone with her husband   2 Cats in the  home   G4P4   LIFESTYLE:    Exercise:     Tobacco/ETS:no   Alcohol: per day  0 - 5 per week    Sugar beverages:   Sleep: 7 hours    Drug use: no       Outpatient Encounter Prescriptions as of 03/31/2014  Medication Sig  . lisinopril (PRINIVIL,ZESTRIL) 10 MG tablet Take 1 tablet (10 mg total) by mouth daily.    EXAM:  BP 148/90 mmHg  Temp(Src) 98.6 F (37 C) (Oral)  Ht 5' 4.5" (1.638 m)  Wt 143 lb 9.6 oz (65.137 kg)  BMI 24.28 kg/m2  Body mass index is 24.28 kg/(m^2). Physical Exam:  repeat bp 148 140 90  Vital signs reviewed IYM:EBRA is a well-developed well-nourished alert cooperative    who appearsr stated age in no acute distress.  HEENT: normocephalic atraumatic , Eyes: PERRL EOM's full, conjunctiva clear, Nares: paten,t no deformity  congested  nodischarge, Ears: no deformity EAC's clear TMs with normal landmarks. Mouth: clear OP, no lesions,  edema.  Moist mucous membranes. Dentition in adequate repair. NECK: supple without masses, thyromegaly or bruits. CHEST/PULM:  Clear to auscultation and percussion breath sounds equal no wheeze , rales or rhonchi. No chest wall deformities or tenderness. CV: PMI is nondisplaced, S1 S2 no gallops, murmurs, rubs. Peripheral pulses are full without delay.No JVD .  ABDOMEN: Bowel sounds normal nontender  No guard or rebound, no hepato splenomegal no CVA tenderness.   Extremtities:  No clubbing cyanosis or edema, no acute joint swelling or redness no focal atrophy NEURO:  Oriented x3, cranial nerves 3-12 appear to be intact, no obvious focal weakness,gait within normal limits no abnormal reflexes or asymmetrical SKIN: No acute rashes normal turgor, color, no bruising or petechiae. PSYCH: Oriented, good eye contact, no obvious depression anxiety, cognition and judgment appear normal. LN: no cervical l adenopathy  Lab Results  Component Value Date   WBC 5.8 09/20/2009   HGB 14.7 09/20/2009   HCT 43.4 09/20/2009   PLT 254.0 09/20/2009   GLUCOSE 83 09/20/2009   CHOL 241* 09/20/2009   TRIG 103.0 09/20/2009   HDL 77.60 09/20/2009   LDLDIRECT 146.9 09/20/2009   LDLCALC 118* 11/05/2006   ALT 23 09/20/2009   AST 21 09/20/2009   NA 144 09/20/2009   K 4.7 09/20/2009   CL 106 09/20/2009   CREATININE 0.6 09/20/2009   BUN 20 09/20/2009   CO2 30 09/20/2009   TSH 1.40 09/20/2009    ASSESSMENT AND PLAN:  Discussed the following assessment and plan:  Elevated BP  Other chronic sinusitis  Renal stone BP  very high today  Better on repeat ; need more info no hx of acute ht   Will monitor  Could be meds  Med to start if elevated at home  Patient Care Team: Burnis Medin, MD as PCP - General (Internal Medicine) Molli Posey, MD as Consulting Physician (Obstetrics and Gynecology) [facility] Opthalmology (Ophthalmology) Jari Pigg, MD as Consulting Physician (Dermatology) Patient  Instructions    Take blood pressure readings twice a day for 10 - 14 days and then periodically .To ensure below 140/90   Bring  then im with a monitor .     If up most days we may begin low dose BP medication.     FLONASE / nasacort  Saline nose spray  For sinuses . Tylenol if needed for pain  consideration of other rx for sinus inflammation.  Hypertension Hypertension, commonly called high blood pressure,  is when the force of blood pumping through your arteries is too strong. Your arteries are the blood vessels that carry blood from your heart throughout your body. A blood pressure reading consists of a higher number over a lower number, such as 110/72. The higher number (systolic) is the pressure inside your arteries when your heart pumps. The lower number (diastolic) is the pressure inside your arteries when your heart relaxes. Ideally you want your blood pressure below 120/80. Hypertension forces your heart to work harder to pump blood. Your arteries may become narrow or stiff. Having hypertension puts you at risk for heart disease, stroke, and other problems.  RISK FACTORS Some risk factors for high blood pressure are controllable. Others are not.  Risk factors you cannot control include:   Race. You may be at higher risk if you are African American.  Age. Risk increases with age.  Gender. Men are at higher risk than women before age 66 years. After age 44, women are at higher risk than men. Risk factors you can control include:  Not getting enough exercise or physical activity.  Being overweight.  Getting too much fat, sugar, calories, or salt in your diet.  Drinking too much alcohol. SIGNS AND SYMPTOMS Hypertension does not usually cause signs or symptoms. Extremely high blood pressure (hypertensive crisis) may cause headache, anxiety, shortness of breath, and nosebleed. DIAGNOSIS  To check if you have hypertension, your health care provider will measure your blood pressure  while you are seated, with your arm held at the level of your heart. It should be measured at least twice using the same arm. Certain conditions can cause a difference in blood pressure between your right and left arms. A blood pressure reading that is higher than normal on one occasion does not mean that you need treatment. If one blood pressure reading is high, ask your health care provider about having it checked again. TREATMENT  Treating high blood pressure includes making lifestyle changes and possibly taking medicine. Living a healthy lifestyle can help lower high blood pressure. You may need to change some of your habits. Lifestyle changes may include:  Following the DASH diet. This diet is high in fruits, vegetables, and whole grains. It is low in salt, red meat, and added sugars.  Getting at least 2 hours of brisk physical activity every week.  Losing weight if necessary.  Not smoking.  Limiting alcoholic beverages.  Learning ways to reduce stress. If lifestyle changes are not enough to get your blood pressure under control, your health care provider may prescribe medicine. You may need to take more than one. Work closely with your health care provider to understand the risks and benefits. HOME CARE INSTRUCTIONS  Have your blood pressure rechecked as directed by your health care provider.   Take medicines only as directed by your health care provider. Follow the directions carefully. Blood pressure medicines must be taken as prescribed. The medicine does not work as well when you skip doses. Skipping doses also puts you at risk for problems.   Do not smoke.   Monitor your blood pressure at home as directed by your health care provider. SEEK MEDICAL CARE IF:   You think you are having a reaction to medicines taken.  You have recurrent headaches or feel dizzy.  You have swelling in your ankles.  You have trouble with your vision. SEEK IMMEDIATE MEDICAL CARE IF:  You  develop a severe headache or confusion.  You have unusual weakness, numbness,  or feel faint.  You have severe chest or abdominal pain.  You vomit repeatedly.  You have trouble breathing. MAKE SURE YOU:   Understand these instructions.  Will watch your condition.  Will get help right away if you are not doing well or get worse. Document Released: 01/15/2005 Document Revised: 06/01/2013 Document Reviewed: 11/07/2012 [facility] Patient Information 2015 [place], [place]. This information is not intended to replace advice given to you by your health care provider. Make sure you discuss any questions you have with your health care provider.    How to Take Your Blood Pressure HOW DO I GET A BLOOD PRESSURE MACHINE?  You can buy an electronic home blood pressure machine at your local pharmacy. Insurance will sometimes cover the cost if you have a prescription.  Ask your doctor what type of machine is best for you. There are different machines for your arm and your wrist.  If you decide to buy a machine to check your blood pressure on your arm, first check the size of your arm so you can buy the right size cuff. To check the size of your arm:   Use a measuring tape that shows both inches and centimeters.   Wrap the measuring tape around the upper-middle part of your arm. You may need someone to help you measure.   Write down your arm measurement in both inches and centimeters.   To measure your blood pressure correctly, it is important to have the right size cuff.   If your arm is up to 13 inches (up to 34 centimeters), get an adult cuff size.  If your arm is 13 to 17 inches (35 to 44 centimeters), get a large adult cuff size.    If your arm is 17 to 20 inches (45 to 52 centimeters), get an adult thigh cuff.  WHAT DO THE NUMBERS MEAN?   There are two numbers that make up your blood pressure. For example: 120/80.  The first number (120 in our example) is called the "systolic  pressure." It is a measure of the pressure in your blood vessels when your heart is pumping blood.  The second number (80 in our example) is called the "diastolic pressure." It is a measure of the pressure in your blood vessels when your heart is resting between beats.  Your doctor will tell you what your blood pressure should be. WHAT SHOULD I DO BEFORE I CHECK MY BLOOD PRESSURE?   Try to rest or relax for at least 30 minutes before you check your blood pressure.  Do not smoke.  Do not have any drinks with caffeine, such as:  Soda.  Coffee.  Tea.  Check your blood pressure in a quiet room.  Sit down and stretch out your arm on a table. Keep your arm at about the level of your heart. Let your arm relax.  Make sure that your legs are not crossed. HOW DO I CHECK MY BLOOD PRESSURE?  Follow the directions that came with your machine.  Make sure you remove any tight-fighting clothing from your arm or wrist. Wrap the cuff around your upper arm or wrist. You should be able to fit a finger between the cuff and your arm. If you cannot fit a finger between the cuff and your arm, it is too tight and should be removed and rewrapped.  Some units require you to manually pump up the arm cuff.  Automatic units inflate the cuff when you press a button.  Cuff deflation  is automatic in both models.  After the cuff is inflated, the unit measures your blood pressure and pulse. The readings are shown on a monitor. Hold still and breathe normally while the cuff is inflated.  Getting a reading takes less than a minute.  Some models store readings in a memory. Some provide a printout of readings. If your machine does not store your readings, keep a written record.  Take readings with you to your next visit with your doctor. Document Released: 12/29/2007 Document Revised: 06/01/2013 Document Reviewed: 03/12/2013 [facility] Patient Information 2015 [place], [place]. This information is not intended to  replace advice given to you by your health care provider. Make sure you discuss any questions you have with your health care provider.    DASH Eating Plan DASH stands for "Dietary Approaches to Stop Hypertension." The DASH eating plan is a healthy eating plan that has been shown to reduce high blood pressure (hypertension). Additional health benefits may include reducing the risk of type 2 diabetes mellitus, heart disease, and stroke. The DASH eating plan may also help with weight loss. WHAT DO I NEED TO KNOW ABOUT THE DASH EATING PLAN? For the DASH eating plan, you will follow these general guidelines:  Choose foods with a percent daily value for sodium of less than 5% (as listed on the food label).  Use salt-free seasonings or herbs instead of table salt or sea salt.  Check with your health care provider or pharmacist before using salt substitutes.  Eat lower-sodium products, often labeled as "lower sodium" or "no salt added."  Eat fresh foods.  Eat more vegetables, fruits, and low-fat dairy products.  Choose whole grains. Look for the word "whole" as the first word in the ingredient list.  Choose fish and skinless chicken or [place] more often than red meat. Limit fish, poultry, and meat to 6 oz (170 g) each day.  Limit sweets, desserts, sugars, and sugary drinks.  Choose heart-healthy fats.  Limit cheese to 1 oz (28 g) per day.  Eat more home-cooked food and less restaurant, buffet, and fast food.  Limit fried foods.  Cook foods using methods other than frying.  Limit canned vegetables. If you do use them, rinse them well to decrease the sodium.  When eating at a restaurant, ask that your food be prepared with less salt, or no salt if possible. WHAT FOODS CAN I EAT? Seek help from a dietitian for individual calorie needs. Grains Whole grain or whole wheat bread. Brown rice. Whole grain or whole wheat pasta. Quinoa, bulgur, and whole grain cereals. Low-sodium cereals.  Corn or whole wheat flour tortillas. Whole grain cornbread. Whole grain crackers. Low-sodium crackers. Vegetables Fresh or frozen vegetables (raw, steamed, roasted, or grilled). Low-sodium or reduced-sodium tomato and vegetable juices. Low-sodium or reduced-sodium tomato sauce and paste. Low-sodium or reduced-sodium canned vegetables.  Fruits All fresh, canned (in natural juice), or frozen fruits. Meat and Other Protein Products Ground beef (85% or leaner), grass-fed beef, or beef trimmed of fat. Skinless chicken or [place]. Ground chicken or [place]. Pork trimmed of fat. All fish and seafood. Eggs. Dried beans, peas, or lentils. Unsalted nuts and seeds. Unsalted canned beans. Dairy Low-fat dairy products, such as skim or 1% milk, 2% or reduced-fat cheeses, low-fat ricotta or cottage cheese, or plain low-fat yogurt. Low-sodium or reduced-sodium cheeses. Fats and Oils Tub margarines without trans fats. Light or reduced-fat mayonnaise and salad dressings (reduced sodium). Avocado. Safflower, olive, or canola oils. Natural peanut or almond butter. Other  Unsalted popcorn and pretzels. The items listed above may not be a complete list of recommended foods or beverages. Contact your dietitian for more options. WHAT FOODS ARE NOT RECOMMENDED? Grains White bread. White pasta. White rice. Refined cornbread. Bagels and croissants. Crackers that contain trans fat. Vegetables Creamed or fried vegetables. Vegetables in a cheese sauce. Regular canned vegetables. Regular canned tomato sauce and paste. Regular tomato and vegetable juices. Fruits Dried fruits. Canned fruit in light or heavy syrup. Fruit juice. Meat and Other Protein Products Fatty cuts of meat. Ribs, chicken wings, bacon, sausage, bologna, salami, chitterlings, fatback, hot dogs, bratwurst, and packaged luncheon meats. Salted nuts and seeds. Canned beans with salt. Dairy Whole or 2% milk, cream, half-and-half, and cream cheese. Whole-fat or  sweetened yogurt. Full-fat cheeses or blue cheese. Nondairy creamers and whipped toppings. Processed cheese, cheese spreads, or cheese curds. Condiments Onion and garlic salt, seasoned salt, table salt, and sea salt. Canned and packaged gravies. Worcestershire sauce. Tartar sauce. Barbecue sauce. Teriyaki sauce. Soy sauce, including reduced sodium. Steak sauce. Fish sauce. Oyster sauce. Cocktail sauce. Horseradish. Ketchup and mustard. Meat flavorings and tenderizers. Bouillon cubes. Hot sauce. Tabasco sauce. Marinades. Taco seasonings. Relishes. Fats and Oils Butter, stick margarine, lard, shortening, ghee, and bacon fat. Coconut, palm kernel, or palm oils. Regular salad dressings. Other Pickles and olives. Salted popcorn and pretzels. The items listed above may not be a complete list of foods and beverages to avoid. Contact your dietitian for more information. WHERE CAN I FIND MORE INFORMATION? National Heart, Lung, and Blood Institute: travelstabloid.com Document Released: 01/04/2011 Document Revised: 06/01/2013 Document Reviewed: 11/19/2012 [facility] Patient Information 2015 [place], [place]. This information is not intended to replace advice given to you by your health care provider. Make sure you discuss any questions you have with your health care provider.       Standley Brooking. Calea Hribar M.D.

## 2014-03-31 NOTE — Patient Instructions (Addendum)
Take blood pressure readings twice a day for 10 - 14 days and then periodically .To ensure below 140/90   Bring  then im with a monitor .     If up most days we may begin low dose BP medication.     FLONASE / nasacort  Saline nose spray  For sinuses . Tylenol if needed for pain  consideration of other rx for sinus inflammation.  Hypertension Hypertension, commonly called high blood pressure, is when the force of blood pumping through your arteries is too strong. Your arteries are the blood vessels that carry blood from your heart throughout your body. A blood pressure reading consists of a higher number over a lower number, such as 110/72. The higher number (systolic) is the pressure inside your arteries when your heart pumps. The lower number (diastolic) is the pressure inside your arteries when your heart relaxes. Ideally you want your blood pressure below 120/80. Hypertension forces your heart to work harder to pump blood. Your arteries may become narrow or stiff. Having hypertension puts you at risk for heart disease, stroke, and other problems.  RISK FACTORS Some risk factors for high blood pressure are controllable. Others are not.  Risk factors you cannot control include:   Race. You may be at higher risk if you are African American.  Age. Risk increases with age.  Gender. Men are at higher risk than women before age 76 years. After age 90, women are at higher risk than men. Risk factors you can control include:  Not getting enough exercise or physical activity.  Being overweight.  Getting too much fat, sugar, calories, or salt in your diet.  Drinking too much alcohol. SIGNS AND SYMPTOMS Hypertension does not usually cause signs or symptoms. Extremely high blood pressure (hypertensive crisis) may cause headache, anxiety, shortness of breath, and nosebleed. DIAGNOSIS  To check if you have hypertension, your health care provider will measure your blood pressure while you are  seated, with your arm held at the level of your heart. It should be measured at least twice using the same arm. Certain conditions can cause a difference in blood pressure between your right and left arms. A blood pressure reading that is higher than normal on one occasion does not mean that you need treatment. If one blood pressure reading is high, ask your health care provider about having it checked again. TREATMENT  Treating high blood pressure includes making lifestyle changes and possibly taking medicine. Living a healthy lifestyle can help lower high blood pressure. You may need to change some of your habits. Lifestyle changes may include:  Following the DASH diet. This diet is high in fruits, vegetables, and whole grains. It is low in salt, red meat, and added sugars.  Getting at least 2 hours of brisk physical activity every week.  Losing weight if necessary.  Not smoking.  Limiting alcoholic beverages.  Learning ways to reduce stress. If lifestyle changes are not enough to get your blood pressure under control, your health care provider may prescribe medicine. You may need to take more than one. Work closely with your health care provider to understand the risks and benefits. HOME CARE INSTRUCTIONS  Have your blood pressure rechecked as directed by your health care provider.   Take medicines only as directed by your health care provider. Follow the directions carefully. Blood pressure medicines must be taken as prescribed. The medicine does not work as well when you skip doses. Skipping doses also puts you at  risk for problems.   Do not smoke.   Monitor your blood pressure at home as directed by your health care provider. SEEK MEDICAL CARE IF:   You think you are having a reaction to medicines taken.  You have recurrent headaches or feel dizzy.  You have swelling in your ankles.  You have trouble with your vision. SEEK IMMEDIATE MEDICAL CARE IF:  You develop a  severe headache or confusion.  You have unusual weakness, numbness, or feel faint.  You have severe chest or abdominal pain.  You vomit repeatedly.  You have trouble breathing. MAKE SURE YOU:   Understand these instructions.  Will watch your condition.  Will get help right away if you are not doing well or get worse. Document Released: 01/15/2005 Document Revised: 06/01/2013 Document Reviewed: 11/07/2012 [facility] Patient Information 2015 [place], [place]. This information is not intended to replace advice given to you by your health care provider. Make sure you discuss any questions you have with your health care provider.    How to Take Your Blood Pressure HOW DO I GET A BLOOD PRESSURE MACHINE?  You can buy an electronic home blood pressure machine at your local pharmacy. Insurance will sometimes cover the cost if you have a prescription.  Ask your doctor what type of machine is best for you. There are different machines for your arm and your wrist.  If you decide to buy a machine to check your blood pressure on your arm, first check the size of your arm so you can buy the right size cuff. To check the size of your arm:   Use a measuring tape that shows both inches and centimeters.   Wrap the measuring tape around the upper-middle part of your arm. You may need someone to help you measure.   Write down your arm measurement in both inches and centimeters.   To measure your blood pressure correctly, it is important to have the right size cuff.   If your arm is up to 13 inches (up to 34 centimeters), get an adult cuff size.  If your arm is 13 to 17 inches (35 to 44 centimeters), get a large adult cuff size.    If your arm is 17 to 20 inches (45 to 52 centimeters), get an adult thigh cuff.  WHAT DO THE NUMBERS MEAN?   There are two numbers that make up your blood pressure. For example: 120/80.  The first number (120 in our example) is called the "systolic pressure."  It is a measure of the pressure in your blood vessels when your heart is pumping blood.  The second number (80 in our example) is called the "diastolic pressure." It is a measure of the pressure in your blood vessels when your heart is resting between beats.  Your doctor will tell you what your blood pressure should be. WHAT SHOULD I DO BEFORE I CHECK MY BLOOD PRESSURE?   Try to rest or relax for at least 30 minutes before you check your blood pressure.  Do not smoke.  Do not have any drinks with caffeine, such as:  Soda.  Coffee.  Tea.  Check your blood pressure in a quiet room.  Sit down and stretch out your arm on a table. Keep your arm at about the level of your heart. Let your arm relax.  Make sure that your legs are not crossed. HOW DO I CHECK MY BLOOD PRESSURE?  Follow the directions that came with your machine.  Make sure  you remove any tight-fighting clothing from your arm or wrist. Wrap the cuff around your upper arm or wrist. You should be able to fit a finger between the cuff and your arm. If you cannot fit a finger between the cuff and your arm, it is too tight and should be removed and rewrapped.  Some units require you to manually pump up the arm cuff.  Automatic units inflate the cuff when you press a button.  Cuff deflation is automatic in both models.  After the cuff is inflated, the unit measures your blood pressure and pulse. The readings are shown on a monitor. Hold still and breathe normally while the cuff is inflated.  Getting a reading takes less than a minute.  Some models store readings in a memory. Some provide a printout of readings. If your machine does not store your readings, keep a written record.  Take readings with you to your next visit with your doctor. Document Released: 12/29/2007 Document Revised: 06/01/2013 Document Reviewed: 03/12/2013 [facility] Patient Information 2015 [place], [place]. This information is not intended to replace  advice given to you by your health care provider. Make sure you discuss any questions you have with your health care provider.    DASH Eating Plan DASH stands for "Dietary Approaches to Stop Hypertension." The DASH eating plan is a healthy eating plan that has been shown to reduce high blood pressure (hypertension). Additional health benefits may include reducing the risk of type 2 diabetes mellitus, heart disease, and stroke. The DASH eating plan may also help with weight loss. WHAT DO I NEED TO KNOW ABOUT THE DASH EATING PLAN? For the DASH eating plan, you will follow these general guidelines:  Choose foods with a percent daily value for sodium of less than 5% (as listed on the food label).  Use salt-free seasonings or herbs instead of table salt or sea salt.  Check with your health care provider or pharmacist before using salt substitutes.  Eat lower-sodium products, often labeled as "lower sodium" or "no salt added."  Eat fresh foods.  Eat more vegetables, fruits, and low-fat dairy products.  Choose whole grains. Look for the word "whole" as the first word in the ingredient list.  Choose fish and skinless chicken or [place] more often than red meat. Limit fish, poultry, and meat to 6 oz (170 g) each day.  Limit sweets, desserts, sugars, and sugary drinks.  Choose heart-healthy fats.  Limit cheese to 1 oz (28 g) per day.  Eat more home-cooked food and less restaurant, buffet, and fast food.  Limit fried foods.  Cook foods using methods other than frying.  Limit canned vegetables. If you do use them, rinse them well to decrease the sodium.  When eating at a restaurant, ask that your food be prepared with less salt, or no salt if possible. WHAT FOODS CAN I EAT? Seek help from a dietitian for individual calorie needs. Grains Whole grain or whole wheat bread. Brown rice. Whole grain or whole wheat pasta. Quinoa, bulgur, and whole grain cereals. Low-sodium cereals. Corn or  whole wheat flour tortillas. Whole grain cornbread. Whole grain crackers. Low-sodium crackers. Vegetables Fresh or frozen vegetables (raw, steamed, roasted, or grilled). Low-sodium or reduced-sodium tomato and vegetable juices. Low-sodium or reduced-sodium tomato sauce and paste. Low-sodium or reduced-sodium canned vegetables.  Fruits All fresh, canned (in natural juice), or frozen fruits. Meat and Other Protein Products Ground beef (85% or leaner), grass-fed beef, or beef trimmed of fat. Skinless chicken or [place].  Ground chicken or [place]. Pork trimmed of fat. All fish and seafood. Eggs. Dried beans, peas, or lentils. Unsalted nuts and seeds. Unsalted canned beans. Dairy Low-fat dairy products, such as skim or 1% milk, 2% or reduced-fat cheeses, low-fat ricotta or cottage cheese, or plain low-fat yogurt. Low-sodium or reduced-sodium cheeses. Fats and Oils Tub margarines without trans fats. Light or reduced-fat mayonnaise and salad dressings (reduced sodium). Avocado. Safflower, olive, or canola oils. Natural peanut or almond butter. Other Unsalted popcorn and pretzels. The items listed above may not be a complete list of recommended foods or beverages. Contact your dietitian for more options. WHAT FOODS ARE NOT RECOMMENDED? Grains White bread. White pasta. White rice. Refined cornbread. Bagels and croissants. Crackers that contain trans fat. Vegetables Creamed or fried vegetables. Vegetables in a cheese sauce. Regular canned vegetables. Regular canned tomato sauce and paste. Regular tomato and vegetable juices. Fruits Dried fruits. Canned fruit in light or heavy syrup. Fruit juice. Meat and Other Protein Products Fatty cuts of meat. Ribs, chicken wings, bacon, sausage, bologna, salami, chitterlings, fatback, hot dogs, bratwurst, and packaged luncheon meats. Salted nuts and seeds. Canned beans with salt. Dairy Whole or 2% milk, cream, half-and-half, and cream cheese. Whole-fat or sweetened  yogurt. Full-fat cheeses or blue cheese. Nondairy creamers and whipped toppings. Processed cheese, cheese spreads, or cheese curds. Condiments Onion and garlic salt, seasoned salt, table salt, and sea salt. Canned and packaged gravies. Worcestershire sauce. Tartar sauce. Barbecue sauce. Teriyaki sauce. Soy sauce, including reduced sodium. Steak sauce. Fish sauce. Oyster sauce. Cocktail sauce. Horseradish. Ketchup and mustard. Meat flavorings and tenderizers. Bouillon cubes. Hot sauce. Tabasco sauce. Marinades. Taco seasonings. Relishes. Fats and Oils Butter, stick margarine, lard, shortening, ghee, and bacon fat. Coconut, palm kernel, or palm oils. Regular salad dressings. Other Pickles and olives. Salted popcorn and pretzels. The items listed above may not be a complete list of foods and beverages to avoid. Contact your dietitian for more information. WHERE CAN I FIND MORE INFORMATION? National [employer], [employer], [employer] Institute: travelstabloid.com Document Released: 01/04/2011 Document Revised: 06/01/2013 Document Reviewed: 11/19/2012 [facility] Patient Information 2015 [place], [place]. This information is not intended to replace advice given to you by your health care provider. Make sure you discuss any questions you have with your health care provider.

## 2014-04-08 DIAGNOSIS — N200 Calculus of kidney: Secondary | ICD-10-CM | POA: Insufficient documentation

## 2014-04-22 ENCOUNTER — Other Ambulatory Visit (INDEPENDENT_AMBULATORY_CARE_PROVIDER_SITE_OTHER): Payer: BLUE CROSS/BLUE SHIELD

## 2014-04-22 DIAGNOSIS — Z0000 Encounter for general adult medical examination without abnormal findings: Secondary | ICD-10-CM | POA: Diagnosis not present

## 2014-04-22 LAB — LIPID PANEL
CHOLESTEROL: 225 mg/dL — AB (ref 0–200)
HDL: 72.8 mg/dL (ref >39.00)
LDL Cholesterol: 140 mg/dL — ABNORMAL HIGH (ref 0–99)
NonHDL: 152.2
Total CHOL/HDL Ratio: 3
Triglycerides: 62 mg/dL (ref 0.0–149.0)
VLDL: 12.4 mg/dL (ref 0.0–40.0)

## 2014-04-22 LAB — CBC WITH DIFFERENTIAL/PLATELET
BASOS PCT: 0.6 % (ref 0.0–3.0)
Basophils Absolute: 0 10*3/uL (ref 0.0–0.1)
EOS PCT: 2.5 % (ref 0.0–5.0)
Eosinophils Absolute: 0.2 10*3/uL (ref 0.0–0.7)
HEMATOCRIT: 43.7 % (ref 36.0–46.0)
Hemoglobin: 14.9 g/dL (ref 12.0–15.0)
LYMPHS ABS: 2.7 10*3/uL (ref 0.7–4.0)
Lymphocytes Relative: 39.9 % (ref 12.0–46.0)
MCHC: 34.2 g/dL (ref 30.0–36.0)
MCV: 91.8 fl (ref 78.0–100.0)
MONO ABS: 0.7 10*3/uL (ref 0.1–1.0)
Monocytes Relative: 10.7 % (ref 3.0–12.0)
NEUTROS ABS: 3.1 10*3/uL (ref 1.4–7.7)
NEUTROS PCT: 46.3 % (ref 43.0–77.0)
Platelets: 325 10*3/uL (ref 150.0–400.0)
RBC: 4.76 Mil/uL (ref 3.87–5.11)
RDW: 13.9 % (ref 11.5–15.5)
WBC: 6.7 10*3/uL (ref 4.0–10.5)

## 2014-04-22 LAB — HEPATIC FUNCTION PANEL
ALBUMIN: 4.5 g/dL (ref 3.5–5.2)
ALT: 19 U/L (ref 0–35)
AST: 17 U/L (ref 0–37)
Alkaline Phosphatase: 88 U/L (ref 39–117)
Bilirubin, Direct: 0 mg/dL (ref 0.0–0.3)
TOTAL PROTEIN: 7.4 g/dL (ref 6.0–8.3)
Total Bilirubin: 0.4 mg/dL (ref 0.2–1.2)

## 2014-04-22 LAB — TSH: TSH: 2.15 u[IU]/mL (ref 0.35–4.50)

## 2014-04-22 LAB — BASIC METABOLIC PANEL
BUN: 21 mg/dL (ref 6–23)
CALCIUM: 10 mg/dL (ref 8.4–10.5)
CO2: 30 meq/L (ref 19–32)
CREATININE: 0.83 mg/dL (ref 0.40–1.20)
Chloride: 103 mEq/L (ref 96–112)
GFR: 75.12 mL/min (ref >60.00)
Glucose, Bld: 94 mg/dL (ref 70–99)
Potassium: 4.6 mEq/L (ref 3.5–5.1)
Sodium: 140 mEq/L (ref 135–145)

## 2014-04-29 ENCOUNTER — Ambulatory Visit (INDEPENDENT_AMBULATORY_CARE_PROVIDER_SITE_OTHER): Payer: BLUE CROSS/BLUE SHIELD | Admitting: Internal Medicine

## 2014-04-29 ENCOUNTER — Encounter: Payer: Self-pay | Admitting: Internal Medicine

## 2014-04-29 VITALS — BP 136/80 | Temp 97.8°F | Ht 64.5 in | Wt 144.8 lb

## 2014-04-29 DIAGNOSIS — R030 Elevated blood-pressure reading, without diagnosis of hypertension: Secondary | ICD-10-CM | POA: Diagnosis not present

## 2014-04-29 DIAGNOSIS — IMO0001 Reserved for inherently not codable concepts without codable children: Secondary | ICD-10-CM

## 2014-04-29 DIAGNOSIS — H8110 Benign paroxysmal vertigo, unspecified ear: Secondary | ICD-10-CM

## 2014-04-29 NOTE — Progress Notes (Signed)
Pre visit review using our clinic review tool, if applicable. No additional management support is needed unless otherwise documented below in the visit note. Chief Complaint  Patient presents with  . Follow-up    HPI: Bridget Elliott 59 y.o. comes in for fu of elevaetd bp  See last note new patient  Since that time. Has been monitoring bp and up and dow about 50% were up 140 150 rest good and a few  Below 110 has monitor . Didn't start the med   Hx of vertigo when going laying to  sitting and other wise ok  Off an on for a while ? bpv ROS: See pertinent positives and negatives per HPI.  Past Medical History  Diagnosis Date  . Renal stone     hx of removal and lithotrypsy   . Hx of varicella     Family History  Problem Relation Age of Onset  . Cancer Mother   . Hypertension Mother   . Hyperlipidemia Mother   . Cancer Father     Lung,esophageal  . Heart attack Maternal Grandfather   . Heart attack Paternal Grandfather     History   Social History  . Marital Status: Married    Spouse Name: N/A  . Number of Children: N/A  . Years of Education: N/A   Social History Main Topics  . Smoking status: Never Smoker   . Smokeless tobacco: Not on file  . Alcohol Use: 0.0 oz/week    0 Standard drinks or equivalent per week     Comment: 5 glasses of wine a week  . Drug Use: No  . Sexual Activity:    Partners: Male   Other Topics Concern  . None   Social History Narrative   7 hours of sleep per night   Does not work outside the home   Lives alone with her husband   2 Cats in the home   G4P4   LIFESTYLE:    Exercise:     Tobacco/ETS:no   Alcohol: per day  0 - 5 per week    Sugar beverages:   Sleep: 7 hours    Drug use: no       Outpatient Encounter Prescriptions as of 04/29/2014  Medication Sig  . lisinopril (PRINIVIL,ZESTRIL) 10 MG tablet Take 1 tablet (10 mg total) by mouth daily. (Patient not taking: Reported on 04/29/2014)    EXAM:  BP 136/80 mmHg   Temp(Src) 97.8 F (36.6 C) (Oral)  Ht 5' 4.5" (1.638 m)  Wt 144 lb 12.8 oz (65.681 kg)  BMI 24.48 kg/m2  Body mass index is 24.48 kg/(m^2).  GENERAL: vitals reviewed and listed above, alert, oriented, appears well hydrated and in no acute distress HEENT: atraumatic, conjunctiva  clear, no obvious abnormalities on inspection of external nose and ears MS: moves all extremities without noticeable focal  abnormality PSYCH: pleasant and cooperative, no obvious depression or anxiety Lab Results  Component Value Date   WBC 6.7 04/22/2014   HGB 14.9 04/22/2014   HCT 43.7 04/22/2014   PLT 325.0 04/22/2014   GLUCOSE 94 04/22/2014   CHOL 225* 04/22/2014   TRIG 62.0 04/22/2014   HDL 72.80 04/22/2014   LDLDIRECT 146.9 09/20/2009   LDLCALC 140* 04/22/2014   ALT 19 04/22/2014   AST 17 04/22/2014   NA 140 04/22/2014   K 4.6 04/22/2014   CL 103 04/22/2014   CREATININE 0.83 04/22/2014   BUN 21 04/22/2014   CO2 30 04/22/2014   TSH  2.15 04/22/2014  labs reviewed  BP Readings from Last 3 Encounters:  04/29/14 136/80  03/31/14 148/90  10/22/11 112/80   13 READSINGS     119/59  LEFT ARM  AND    108/75  Office  Machine 116/70 correlated both arms  ASSESSMENT AND PLAN:  Discussed the following assessment and plan:  Elevated BP - better today  cont lsi  no meds unless not controlled ,home monitor correlates  Benign paroxysmal positional vertigo, unspecified laterality - off an on for years?   pt referral vestibular rx - Plan: Ambulatory referral to Physical Therapy  -Patient advised to return or notify health care team  if symptoms worsen ,persist or new concerns arise.  Patient Instructions  Continue life style help  Because low  Your labs are good cholesterol could be better .  Continue  Monitoring   Without medicine for now. Can do pt referral for BPV. If BP  not controlled . Get back with [place].       Standley Brooking. Panosh M.D.

## 2014-04-29 NOTE — Patient Instructions (Addendum)
Continue life style help  Because low  Your labs are good cholesterol could be better .  Continue  Monitoring   Without medicine for now. Can do pt referral for BPV. If BP  not controlled . Get back with [place].

## 2014-07-06 ENCOUNTER — Other Ambulatory Visit: Payer: Self-pay | Admitting: Obstetrics and Gynecology

## 2014-07-07 LAB — CYTOLOGY - PAP

## 2016-02-28 ENCOUNTER — Other Ambulatory Visit: Payer: Self-pay | Admitting: Obstetrics and Gynecology

## 2016-02-28 DIAGNOSIS — Z1231 Encounter for screening mammogram for malignant neoplasm of breast: Secondary | ICD-10-CM

## 2016-04-02 ENCOUNTER — Ambulatory Visit
Admission: RE | Admit: 2016-04-02 | Discharge: 2016-04-02 | Disposition: A | Discharge status: Home / Self Care | Payer: BLUE CROSS/BLUE SHIELD | Source: Ambulatory Visit | Attending: Obstetrics and Gynecology | Admitting: Obstetrics and Gynecology

## 2016-04-02 DIAGNOSIS — Z1231 Encounter for screening mammogram for malignant neoplasm of breast: Secondary | ICD-10-CM

## 2016-06-14 ENCOUNTER — Other Ambulatory Visit: Payer: BLUE CROSS/BLUE SHIELD

## 2016-06-19 ENCOUNTER — Encounter: Payer: BLUE CROSS/BLUE SHIELD | Admitting: Internal Medicine

## 2016-09-03 NOTE — Progress Notes (Signed)
Chief Complaint  Patient presents with  . Annual Exam    HPI: Patient  Bridget Elliott  61 y.o. comes in today for Preventive Health Care visit  Last visit 3 16  bp not  Talking med so not taking and dentist said 120  Range  And ok .  Feeling well   hasn't checked recently   Health Maintenance  Topic Date Due  . Hepatitis C Screening  [birth year]  . TETANUS/TDAP  07/29/1975  . INFLUENZA VACCINE  08/29/2016  . HIV Screening  09/04/2017 (Originally 07/29/1970)  . COLONOSCOPY  12/08/2016  . PAP SMEAR  07/05/2017  . MAMMOGRAM  04/03/2018   Health Maintenance Review LIFESTYLE:  Exercise:  Walks  Every day  Tobacco/ETS: no Alcohol:  Summer  Wine travel.  3 per week.  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 GI /GU: No adominal pain, vomiting, change in bowel habits. No blood in the stool. No significant GU symptoms. SKIN/HEME: ,no acute skin rashes suspicious lesions or bleeding. No lymphadenopathy, nodules, masses.  NEURO/ PSYCH:  No neurologic signs such as weakness numbness. No depression anxiety. IMM/ Allergy: No unusual infections.  Allergy .   REST of 12 system review negative except as per HPI   Past Medical History:  Diagnosis Date  . ANEMIA 03/21/2007   Qualifier: History of  By: Danny Lawless CMA, [place]    . Hx of varicella   . 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 Scarlet fever 03/21/2007   Qualifier: History of  By: Danny Lawless CMA, [place]    . UTI (urinary tract infection) 10/22/2011    History reviewed. No pertinent surgical history.  Family History  Problem Relation Age of Onset  . Cancer Mother   . Hypertension Mother   . Hyperlipidemia Mother   . Cancer Father       Lung,esophageal  . Heart attack Maternal Grandfather   . Heart attack Paternal Grandfather     Social History   Social History  . Marital status: Married    Spouse name: N/A  . Number of children: N/A  . Years of education: N/A   Social History Main Topics  . Smoking status: Never Smoker  . Smokeless tobacco: Never Used  . Alcohol use 0.0 oz/week     Comment: 5 glasses of wine a week  . Drug use: No  . Sexual activity: Yes    Partners: Male   Other Topics Concern  . None   Social History Narrative   7 hours of sleep per night   Does not work outside the home   Lives alone with her husband   2 Cats in the home   G4P4   LIFESTYLE:    Exercise:     Tobacco/ETS:no   Alcohol: per day  0 - 5 per week    Sugar beverages:   Sleep: 7 hours    Drug use: no          EXAM:  BP 140/82 (BP Location: Right Arm, Patient Position: Sitting, Cuff Size: Normal)   Pulse 74   Temp 97.7 F (36.5 C) (Oral)   Ht 5'  4.75" (1.645 m)   Wt 138 lb 9.6 oz (62.9 kg)   BMI 23.24 kg/m   Body mass index is 23.24 kg/m. Wt Readings from Last 3 Encounters:  09/04/16 138 lb 9.6 oz (62.9 kg)  04/29/14 144 lb 12.8 oz (65.7 kg)  03/31/14 143 lb 9.6 oz (65.1 kg)    Physical Exam: Vital signs reviewed ZWC:HENI is a well-developed well-nourished alert cooperative    who appearsr stated age in no acute distress.  HEENT: normocephalic atraumatic , Eyes: PERRL EOM's full, conjunctiva clear, Nares: paten,t no deformity discharge or tenderness., Ears: no deformity EAC's clear TMs with normal landmarks. Mouth: clear OP, no lesions, edema.  Moist mucous membranes. Dentition in adequate repair. NECK: supple without masses, thyromegaly or bruits. CHEST/PULM:  Clear to auscultation and percussion breath sounds equal no wheeze , rales or rhonchi. No chest wall deformities or tenderness. Breast: normal by inspection . No dimpling, discharge, masses, tenderness or discharge . CV: PMI is nondisplaced,  S1 S2 no gallops, murmurs, rubs. Peripheral pulses are full without delay.No JVD .  ABDOMEN: Bowel sounds normal nontender  No guard or rebound, no hepato splenomegal no CVA tenderness.  No hernia. Extremtities:  No clubbing cyanosis or edema, no acute joint swelling or redness no focal atrophy NEURO:  Oriented x3, cranial nerves 3-12 appear to be intact, no obvious focal weakness,gait within normal limits no abnormal reflexes or asymmetrical SKIN: No acute rashes normal turgor, color, no bruising or petechiae. Sun changes  PSYCH: Oriented, good eye contact, no obvious depression anxiety, cognition and judgment appear normal. LN: no cervical axillary inguinal adenopathy    BP Readings from Last 3 Encounters:  09/04/16 140/82  04/29/14 136/80  03/31/14 (!) 148/90   Wt Readings from Last 3 Encounters:  09/04/16 138 lb 9.6 oz (62.9 kg)  04/29/14 144 lb 12.8 oz (65.7 kg)  03/31/14 143 lb 9.6 oz (65.1 kg)     Lab results reviewed with patient   ASSESSMENT AND PLAN:  Discussed the following assessment and plan:  Visit for preventive health examination - Plan: Basic metabolic panel, CBC with Differential/Platelet, Hepatic function panel, Lipid panel, TSH, Hepatitis C antibody  Hyperlipidemia, unspecified hyperlipidemia type - favoral ratio will reassess neg premature family hx vascular  - Plan: Basic metabolic panel, CBC with Differential/Platelet, Hepatic function panel, Lipid panel, TSH, Hepatitis C antibody  Elevated blood pressure reading - never started med got 120 readigns ast dentist get more readings to ensure at goal 120/80  send in mychart - Plan: Basic metabolic panel, CBC with Differential/Platelet, Hepatic function panel, Lipid panel, TSH, Hepatitis C antibody  Need for hepatitis C screening test - Plan: Hepatitis C antibody Hep c screen  Low risk  Disc bp goal  shingrix vaccine disc   Disc ?s about  crp and cac score usage .  Modifiable risk factors  At thjs time Patient  Care Team: Burnis Medin, MD as PCP - General (Internal Medicine) Molli Posey, MD as Consulting Physician (Obstetrics and Gynecology) [facility] (Ophthalmology) Jari Pigg, MD as Consulting Physician (Dermatology) Patient Instructions  Healthy lifestyle includes : At least 150 minutes of exercise weeks  , weight at healthy levels, which is usually   BMI 19-25. Avoid trans fats and processed foods;  Increase fresh fruits and veges to 5 servings per day. And avoid sweet beverages including tea and juice. Mediterranean diet with olive oil and nuts have been noted to be heart and brain healthy . Avoid tobacco products . Limit  alcohol to  7 per week for women and 14 servings for men.  Get adequate sleep . Wear seat belts . Don't text and drive .   Take blood pressure readings twice a day for 7- 10 days and then periodically   .Send in readings      BP goal is 120/80   Send in readings to decide on intervention.  After bp readings and lipid  Numbers can get you more information about your cardiovasuclar risk  In the next 10 years.    Preventive Care 40-64 Years, Female Preventive care refers to lifestyle choices and visits with your health care provider that can promote health and wellness. What does preventive care include?  A yearly physical exam. This is also called an annual well check.  Dental exams once or twice a year.  Routine eye exams. Ask your health care provider how often you should have your eyes checked.  Personal lifestyle choices, including: ? Daily care of your teeth and gums. ? Regular physical activity. ? Eating a healthy diet. ? Avoiding tobacco and drug use. ? Limiting alcohol use. ? Practicing safe sex. ? Taking low-dose aspirin daily starting at age 68. ? Taking vitamin and mineral supplements as recommended by your health care provider. What happens during an annual well check? The services and screenings done by your health care  provider during your annual well check will depend on your age, overall health, lifestyle risk factors, and family history of disease. Counseling Your health care provider may ask you questions about your:  Alcohol use.  Tobacco use.  Drug use.  Emotional well-being.  Home and relationship well-being.  Sexual activity.  Eating habits.  Work and work Statistician.  Method of birth control.  Menstrual cycle.  Pregnancy history.  Screening You may have the following tests or measurements:  Height, weight, and BMI.  Blood pressure.  Lipid and cholesterol levels. These may be checked every 5 years, or more frequently if you are over 22 years old.  Skin check.  Lung cancer screening. You may have this screening every year starting at age 13 if you have a 30-pack-year history of smoking and currently smoke or have quit within the past 15 years.  Fecal occult blood test (FOBT) of the stool. You may have this test every year starting at age 38.  Flexible sigmoidoscopy or colonoscopy. You may have a sigmoidoscopy every 5 years or a colonoscopy every 10 years starting at age 32.  Hepatitis C blood test.  Hepatitis B blood test.  Sexually transmitted disease (STD) testing.  Diabetes screening. This is done by checking your blood sugar (glucose) after you have not eaten for a while (fasting). You may have this done every 1-3 years.  Mammogram. This may be done every 1-2 years. Talk to your health care provider about when you should start having regular mammograms. This may depend on whether you have a family history of breast cancer.  BRCA-related cancer screening. This may be done if you have a family history of breast, ovarian, tubal, or peritoneal cancers.  Pelvic exam and Pap test. This may be done every 3 years starting at age 54. Starting at age 82, this may be done every 5 years if you have a Pap test in combination with an HPV test.  Bone density scan. This is done  to screen for osteoporosis. You may have this scan if you are at high risk for osteoporosis.  Discuss your test results, treatment options, and  if necessary, the need for more tests with your health care provider. Vaccines Your health care provider may recommend certain vaccines, such as:  Influenza vaccine. This is recommended every year.  Tetanus, diphtheria, and acellular pertussis (Tdap, Td) vaccine. You may need a Td booster every 10 years.  Varicella vaccine. You may need this if you have not been vaccinated.  Zoster vaccine. You may need this after age 57.  Measles, mumps, and rubella (MMR) vaccine. You may need at least one dose of MMR if you were born in 1957 or later. You may also need a second dose.  Pneumococcal 13-valent conjugate (PCV13) vaccine. You may need this if you have certain conditions and were not previously vaccinated.  Pneumococcal polysaccharide (PPSV23) vaccine. You may need one or two doses if you smoke cigarettes or if you have certain conditions.  Meningococcal vaccine. You may need this if you have certain conditions.  Hepatitis A vaccine. You may need this if you have certain conditions or if you travel or work in places where you may be exposed to hepatitis A.  Hepatitis B vaccine. You may need this if you have certain conditions or if you travel or work in places where you may be exposed to hepatitis B.  Haemophilus influenzae type b (Hib) vaccine. You may need this if you have certain conditions.  Talk to your health care provider about which screenings and vaccines you need and how often you need them. This information is not intended to replace advice given to you by your health care provider. Make sure you discuss any questions you have with your health care provider. Document Released: 02/11/2015 Document Revised: 10/05/2015 Document Reviewed: 11/16/2014 Elsevier Interactive Patient Education  2017 [place] K. Panosh  M.D. Lab Results  Component Value Date   WBC 6.4 09/04/2016   HGB 15.7 (H) 09/04/2016   HCT 47.4 (H) 09/04/2016   PLT 310.0 09/04/2016   GLUCOSE 98 09/04/2016   CHOL 236 (H) 09/04/2016   TRIG 69.0 09/04/2016   HDL 77.10 09/04/2016   LDLDIRECT 146.9 09/20/2009   LDLCALC 145 (H) 09/04/2016   ALT 15 09/04/2016   AST 13 09/04/2016   NA 141 09/04/2016   K 4.9 09/04/2016   CL 102 09/04/2016   CREATININE 0.73 09/04/2016   BUN 16 09/04/2016   CO2 31 09/04/2016   TSH 1.30 09/04/2016   The 10-year ASCVD risk score Mikey Bussing DC Jr., et al., 2013) is: 3.5%   Values used to calculate the score:     Age: 65 years     Sex: Female     Is Non-Hispanic African American: No     Diabetic: No     Tobacco smoker: No     Systolic Blood Pressure: 660 mmHg     Is BP treated: No     HDL Cholesterol: 77.1 mg/dL     Total Cholesterol: 236 mg/dL

## 2016-09-04 ENCOUNTER — Ambulatory Visit (INDEPENDENT_AMBULATORY_CARE_PROVIDER_SITE_OTHER): Payer: BLUE CROSS/BLUE SHIELD | Admitting: Internal Medicine

## 2016-09-04 ENCOUNTER — Other Ambulatory Visit: Payer: Self-pay | Admitting: Internal Medicine

## 2016-09-04 ENCOUNTER — Encounter: Payer: Self-pay | Admitting: Internal Medicine

## 2016-09-04 VITALS — BP 140/82 | HR 74 | Temp 97.7°F | Ht 64.75 in | Wt 138.6 lb

## 2016-09-04 DIAGNOSIS — E785 Hyperlipidemia, unspecified: Secondary | ICD-10-CM

## 2016-09-04 DIAGNOSIS — Z0001 Encounter for general adult medical examination with abnormal findings: Secondary | ICD-10-CM

## 2016-09-04 DIAGNOSIS — Z0000 Encounter for general adult medical examination without abnormal findings: Secondary | ICD-10-CM

## 2016-09-04 DIAGNOSIS — R030 Elevated blood-pressure reading, without diagnosis of hypertension: Secondary | ICD-10-CM

## 2016-09-04 DIAGNOSIS — Z1159 Encounter for screening for other viral diseases: Secondary | ICD-10-CM | POA: Diagnosis not present

## 2016-09-04 LAB — CBC WITH DIFFERENTIAL/PLATELET
Basophils Absolute: 0 10*3/uL (ref 0.0–0.1)
Basophils Relative: 0.7 % (ref 0.0–3.0)
EOS ABS: 0.1 10*3/uL (ref 0.0–0.7)
EOS PCT: 1.7 % (ref 0.0–5.0)
HCT: 47.4 % — ABNORMAL HIGH (ref 36.0–46.0)
HEMOGLOBIN: 15.7 g/dL — AB (ref 12.0–15.0)
Lymphocytes Relative: 33.8 % (ref 12.0–46.0)
Lymphs Abs: 2.2 10*3/uL (ref 0.7–4.0)
MCHC: 33.1 g/dL (ref 30.0–36.0)
MCV: 95.3 fl (ref 78.0–100.0)
MONO ABS: 0.5 10*3/uL (ref 0.1–1.0)
Monocytes Relative: 8.5 % (ref 3.0–12.0)
Neutro Abs: 3.5 10*3/uL (ref 1.4–7.7)
Neutrophils Relative %: 55.3 % (ref 43.0–77.0)
Platelets: 310 10*3/uL (ref 150.0–400.0)
RBC: 4.98 Mil/uL (ref 3.87–5.11)
RDW: 13.4 % (ref 11.5–15.5)
WBC: 6.4 10*3/uL (ref 4.0–10.5)

## 2016-09-04 LAB — BASIC METABOLIC PANEL
BUN: 16 mg/dL (ref 6–23)
CHLORIDE: 102 meq/L (ref 96–112)
CO2: 31 mEq/L (ref 19–32)
Calcium: 10.2 mg/dL (ref 8.4–10.5)
Creatinine, Ser: 0.73 mg/dL (ref 0.40–1.20)
GFR: 86.4 mL/min (ref >60.00)
Glucose, Bld: 98 mg/dL (ref 70–99)
POTASSIUM: 4.9 meq/L (ref 3.5–5.1)
Sodium: 141 mEq/L (ref 135–145)

## 2016-09-04 LAB — LIPID PANEL
CHOL/HDL RATIO: 3
Cholesterol: 236 mg/dL — ABNORMAL HIGH (ref 0–200)
HDL: 77.1 mg/dL (ref >39.00)
LDL CALC: 145 mg/dL — AB (ref 0–99)
NonHDL: 158.69
Triglycerides: 69 mg/dL (ref 0.0–149.0)
VLDL: 13.8 mg/dL (ref 0.0–40.0)

## 2016-09-04 LAB — HEPATIC FUNCTION PANEL
ALT: 15 U/L (ref 0–35)
AST: 13 U/L (ref 0–37)
Albumin: 4.9 g/dL (ref 3.5–5.2)
Alkaline Phosphatase: 84 U/L (ref 39–117)
BILIRUBIN DIRECT: 0.1 mg/dL (ref 0.0–0.3)
BILIRUBIN TOTAL: 0.7 mg/dL (ref 0.2–1.2)
TOTAL PROTEIN: 7.5 g/dL (ref 6.0–8.3)

## 2016-09-04 LAB — TSH: TSH: 1.3 u[IU]/mL (ref 0.35–4.50)

## 2016-09-04 NOTE — Patient Instructions (Signed)
Healthy lifestyle includes : At least 150 minutes of exercise weeks  , weight at healthy levels, which is usually   BMI 19-25. Avoid trans fats and processed foods;  Increase fresh fruits and veges to 5 servings per day. And avoid sweet beverages including tea and juice. Mediterranean diet with olive oil and nuts have been noted to be heart and brain healthy . Avoid tobacco products . Limit  alcohol to  7 per week for women and 14 servings for men.  Get adequate sleep . Wear seat belts . Don't text and drive .   Take blood pressure readings twice a day for 7- 10 days and then periodically   .Send in readings      BP goal is 120/80   Send in readings to decide on intervention.  After bp readings and lipid  Numbers can get you more information about your cardiovasuclar risk  In the next 10 years.    Preventive Care 40-64 Years, Female Preventive care refers to lifestyle choices and visits with your health care provider that can promote health and wellness. What does preventive care include?  A yearly physical exam. This is also called an annual well check.  Dental exams once or twice a year.  Routine eye exams. Ask your health care provider how often you should have your eyes checked.  Personal lifestyle choices, including: ? Daily care of your teeth and gums. ? Regular physical activity. ? Eating a healthy diet. ? Avoiding tobacco and drug use. ? Limiting alcohol use. ? Practicing safe sex. ? Taking low-dose aspirin daily starting at age 30. ? Taking vitamin and mineral supplements as recommended by your health care provider. What happens during an annual well check? The services and screenings done by your health care provider during your annual well check will depend on your age, overall health, lifestyle risk factors, and family history of disease. Counseling Your health care provider may ask you questions about your:  Alcohol use.  Tobacco use.  Drug use.  Emotional  well-being.  Home and relationship well-being.  Sexual activity.  Eating habits.  Work and work Statistician.  Method of birth control.  Menstrual cycle.  Pregnancy history.  Screening You may have the following tests or measurements:  Height, weight, and BMI.  Blood pressure.  Lipid and cholesterol levels. These may be checked every 5 years, or more frequently if you are over 88 years old.  Skin check.  Lung cancer screening. You may have this screening every year starting at age 44 if you have a 30-pack-year history of smoking and currently smoke or have quit within the past 15 years.  Fecal occult blood test (FOBT) of the stool. You may have this test every year starting at age 38.  Flexible sigmoidoscopy or colonoscopy. You may have a sigmoidoscopy every 5 years or a colonoscopy every 10 years starting at age 27.  Hepatitis C blood test.  Hepatitis B blood test.  Sexually transmitted disease (STD) testing.  Diabetes screening. This is done by checking your blood sugar (glucose) after you have not eaten for a while (fasting). You may have this done every 1-3 years.  Mammogram. This may be done every 1-2 years. Talk to your health care provider about when you should start having regular mammograms. This may depend on whether you have a family history of breast cancer.  BRCA-related cancer screening. This may be done if you have a family history of breast, ovarian, tubal, or peritoneal cancers.  Pelvic exam and Pap test. This may be done every 3 years starting at age 39. Starting at age 11, this may be done every 5 years if you have a Pap test in combination with an HPV test.  Bone density scan. This is done to screen for osteoporosis. You may have this scan if you are at high risk for osteoporosis.  Discuss your test results, treatment options, and if necessary, the need for more tests with your health care provider. Vaccines Your health care provider may recommend  certain vaccines, such as:  Influenza vaccine. This is recommended every year.  Tetanus, diphtheria, and acellular pertussis (Tdap, Td) vaccine. You may need a Td booster every 10 years.  Varicella vaccine. You may need this if you have not been vaccinated.  Zoster vaccine. You may need this after age 35.  Measles, mumps, and rubella (MMR) vaccine. You may need at least one dose of MMR if you were born in 1957 or later. You may also need a second dose.  Pneumococcal 13-valent conjugate (PCV13) vaccine. You may need this if you have certain conditions and were not previously vaccinated.  Pneumococcal polysaccharide (PPSV23) vaccine. You may need one or two doses if you smoke cigarettes or if you have certain conditions.  Meningococcal vaccine. You may need this if you have certain conditions.  Hepatitis A vaccine. You may need this if you have certain conditions or if you travel or work in places where you may be exposed to hepatitis A.  Hepatitis B vaccine. You may need this if you have certain conditions or if you travel or work in places where you may be exposed to hepatitis B.  Haemophilus influenzae type b (Hib) vaccine. You may need this if you have certain conditions.  Talk to your health care provider about which screenings and vaccines you need and how often you need them. This information is not intended to replace advice given to you by your health care provider. Make sure you discuss any questions you have with your health care provider. Document Released: 02/11/2015 Document Revised: 10/05/2015 Document Reviewed: 11/16/2014 Elsevier Interactive Patient Education  2017 [employer].

## 2016-09-05 LAB — HEPATITIS C ANTIBODY: HCV AB: NONREACTIVE

## 2016-09-06 ENCOUNTER — Other Ambulatory Visit: Payer: Self-pay | Admitting: Emergency Medicine

## 2016-09-06 DIAGNOSIS — D582 Other hemoglobinopathies: Secondary | ICD-10-CM

## 2016-10-19 ENCOUNTER — Encounter: Payer: Self-pay | Admitting: Internal Medicine

## 2016-12-17 ENCOUNTER — Encounter: Payer: Self-pay | Admitting: Gastroenterology

## 2017-01-01 ENCOUNTER — Other Ambulatory Visit (INDEPENDENT_AMBULATORY_CARE_PROVIDER_SITE_OTHER): Payer: BLUE CROSS/BLUE SHIELD

## 2017-01-01 DIAGNOSIS — D582 Other hemoglobinopathies: Secondary | ICD-10-CM

## 2017-01-01 LAB — CBC WITH DIFFERENTIAL/PLATELET
BASOS ABS: 0.1 10*3/uL (ref 0.0–0.1)
Basophils Relative: 1 % (ref 0.0–3.0)
EOS ABS: 0.1 10*3/uL (ref 0.0–0.7)
Eosinophils Relative: 1 % (ref 0.0–5.0)
HEMATOCRIT: 44.9 % (ref 36.0–46.0)
Hemoglobin: 14.9 g/dL (ref 12.0–15.0)
LYMPHS ABS: 2.4 10*3/uL (ref 0.7–4.0)
LYMPHS PCT: 40.4 % (ref 12.0–46.0)
MCHC: 33.3 g/dL (ref 30.0–36.0)
MCV: 94.7 fl (ref 78.0–100.0)
MONO ABS: 0.5 10*3/uL (ref 0.1–1.0)
Monocytes Relative: 8.4 % (ref 3.0–12.0)
NEUTROS PCT: 49.2 % (ref 43.0–77.0)
Neutro Abs: 2.9 10*3/uL (ref 1.4–7.7)
PLATELETS: 330 10*3/uL (ref 150.0–400.0)
RBC: 4.74 Mil/uL (ref 3.87–5.11)
RDW: 13.8 % (ref 11.5–15.5)
WBC: 5.9 10*3/uL (ref 4.0–10.5)

## 2017-02-28 DIAGNOSIS — Z13820 Encounter for screening for osteoporosis: Secondary | ICD-10-CM | POA: Diagnosis not present

## 2017-02-28 DIAGNOSIS — Z6823 Body mass index (BMI) 23.0-23.9, adult: Secondary | ICD-10-CM | POA: Diagnosis not present

## 2017-02-28 DIAGNOSIS — Z01419 Encounter for gynecological examination (general) (routine) without abnormal findings: Secondary | ICD-10-CM | POA: Diagnosis not present

## 2017-03-05 DIAGNOSIS — H43811 Vitreous degeneration, right eye: Secondary | ICD-10-CM | POA: Diagnosis not present

## 2017-03-19 DIAGNOSIS — M8580 Other specified disorders of bone density and structure, unspecified site: Secondary | ICD-10-CM | POA: Diagnosis not present

## 2017-05-21 ENCOUNTER — Other Ambulatory Visit: Payer: Self-pay | Admitting: Obstetrics and Gynecology

## 2017-05-21 DIAGNOSIS — Z1231 Encounter for screening mammogram for malignant neoplasm of breast: Secondary | ICD-10-CM

## 2017-06-12 ENCOUNTER — Ambulatory Visit: Payer: BLUE CROSS/BLUE SHIELD

## 2017-06-13 ENCOUNTER — Ambulatory Visit
Admission: RE | Admit: 2017-06-13 | Discharge: 2017-06-13 | Disposition: A | Discharge status: Home / Self Care | Payer: BLUE CROSS/BLUE SHIELD | Source: Ambulatory Visit | Attending: Obstetrics and Gynecology | Admitting: Obstetrics and Gynecology

## 2017-06-13 DIAGNOSIS — Z1231 Encounter for screening mammogram for malignant neoplasm of breast: Secondary | ICD-10-CM

## 2017-09-06 ENCOUNTER — Encounter: Payer: Self-pay | Admitting: Gastroenterology

## 2017-11-08 ENCOUNTER — Encounter: Payer: Self-pay | Admitting: Gastroenterology

## 2017-11-08 ENCOUNTER — Ambulatory Visit (AMBULATORY_SURGERY_CENTER): Payer: Self-pay | Admitting: *Deleted

## 2017-11-08 VITALS — Ht 66.0 in | Wt 141.0 lb

## 2017-11-08 DIAGNOSIS — Z1211 Encounter for screening for malignant neoplasm of colon: Secondary | ICD-10-CM

## 2017-11-08 MED ORDER — PEG 3350-KCL-NA BICARB-NACL 420 G PO SOLR: 4000.0000 mL | Freq: Once | ORAL | 0 refills | Status: AC

## 2017-11-08 NOTE — Progress Notes (Signed)
No egg or soy allergy known to patient  No issues with past sedation with any surgeries  or procedures, no intubation problems - hx PONV after tonsillectomy  No diet pills per patient No home 02 use per patient  No blood thinners per patient  Pt denies issues with constipation  No A fib or A flutter  EMMI video sent to pt's e mail - pt declined

## 2017-11-22 ENCOUNTER — Ambulatory Visit (AMBULATORY_SURGERY_CENTER): Payer: BLUE CROSS/BLUE SHIELD | Admitting: Gastroenterology

## 2017-11-22 ENCOUNTER — Encounter: Payer: Self-pay | Admitting: Gastroenterology

## 2017-11-22 VITALS — BP 130/78 | HR 75 | Temp 98.0°F | Resp 16 | Ht 66.0 in | Wt 141.0 lb

## 2017-11-22 DIAGNOSIS — D122 Benign neoplasm of ascending colon: Secondary | ICD-10-CM

## 2017-11-22 DIAGNOSIS — Z1211 Encounter for screening for malignant neoplasm of colon: Secondary | ICD-10-CM

## 2017-11-22 MED ORDER — SODIUM CHLORIDE 0.9 % IV SOLN: 500.0000 mL | Freq: Once | INTRAVENOUS | Status: DC

## 2017-11-22 NOTE — Progress Notes (Signed)
Called to room to assist during endoscopic procedure.  Patient ID and intended procedure confirmed with present staff. Received instructions for my participation in the procedure from the performing physician.  

## 2017-11-22 NOTE — Op Note (Signed)
[facility] Patient Name: Bridget Elliott Procedure Date: 11/22/2017 8:15 AM MRN: [id number] Endoscopist: Milus Banister , MD Age: 62 Referring MD:  Date of Birth: November 13, 1955 Gender: Female Account #: [id number] Procedure:                Colonoscopy Indications:              Screening for colorectal malignant neoplasm Medicines:                Monitored Anesthesia Care Procedure:                Pre-Anesthesia Assessment:                           - Prior to the procedure, a History and Physical                            was performed, and patient medications and                            allergies were reviewed. The patient's tolerance of                            previous anesthesia was also reviewed. The risks                            and benefits of the procedure and the sedation                            options and risks were discussed with the patient.                            All questions were answered, and informed consent                            was obtained. Prior Anticoagulants: The patient has                            taken no previous anticoagulant or antiplatelet                            agents. ASA Grade Assessment: II - A patient with                            mild systemic disease. After reviewing the risks                            and benefits, the patient was deemed in                            satisfactory condition to undergo the procedure.                           After obtaining informed consent, the colonoscope  was passed under direct vision. Throughout the                            procedure, the patient's blood pressure, pulse, and                            oxygen saturations were monitored continuously. The                            Colonoscope was introduced through the anus and                            advanced to the the cecum, identified by                            appendiceal orifice and  ileocecal valve. The                            colonoscopy was performed without difficulty. The                            patient tolerated the procedure well. The quality                            of the bowel preparation was good. The ileocecal                            valve, appendiceal orifice, and rectum were                            photographed. Scope In: 8:26:31 AM Scope Out: 8:40:50 AM Scope Withdrawal Time: 0 hours 8 minutes 45 seconds  Total Procedure Duration: 0 hours 14 minutes 19 seconds  Findings:                 A 2 mm polyp was found in the ascending colon. The                            polyp was sessile. The polyp was removed with a                            cold snare. Resection and retrieval were complete.                           The exam was otherwise without abnormality on                            direct and retroflexion views. Complications:            No immediate complications. Estimated blood loss:                            None. Estimated Blood Loss:     Estimated blood loss: none. Impression:               -  One 2 mm polyp in the ascending colon, removed                            with a cold snare. Resected and retrieved.                           - The examination was otherwise normal on direct                            and retroflexion views. Recommendation:           - Patient has a contact number available for                            emergencies. The signs and symptoms of potential                            delayed complications were discussed with the                            patient. Return to normal activities tomorrow.                            Written discharge instructions were provided to the                            patient.                           - Resume previous diet.                           - Continue present medications.                           You will receive a letter within 2-3 weeks with the                             pathology results and my final recommendations.                           If the polyp(s) is proven to be 'pre-cancerous' on                            pathology, you will need repeat colonoscopy in 5                            years. If the polyp(s) is NOT 'precancerous' on                            pathology then you should repeat colon cancer                            screening in 10 years with colonoscopy without need  for colon cancer screening by any method prior to                            then (including stool testing). Milus Banister, MD 11/22/2017 8:43:09 AM This report has been signed electronically.

## 2017-11-22 NOTE — Progress Notes (Signed)
Pt's states no medical or surgical changes since previsit or office visit. 

## 2017-11-22 NOTE — Patient Instructions (Signed)
Handout given on polyps. One polyp removed today.   YOU HAD AN ENDOSCOPIC PROCEDURE TODAY AT [facility]:   Refer to the procedure report that was given to you for any specific questions about what was found during the examination.  If the procedure report does not answer your questions, please call your gastroenterologist to clarify.  If you requested that your care partner not be given the details of your procedure findings, then the procedure report has been included in a sealed envelope for you to review at your convenience later.  YOU SHOULD EXPECT: Some feelings of bloating in the abdomen. Passage of more gas than usual.  Walking can help get rid of the air that was put into your GI tract during the procedure and reduce the bloating. If you had a lower endoscopy (such as a colonoscopy or flexible sigmoidoscopy) you may notice spotting of blood in your stool or on the toilet paper. If you underwent a bowel prep for your procedure, you may not have a normal bowel movement for a few days.  Please Note:  You might notice some irritation and congestion in your nose or some drainage.  This is from the oxygen used during your procedure.  There is no need for concern and it should clear up in a day or so.  SYMPTOMS TO REPORT IMMEDIATELY:   Following lower endoscopy (colonoscopy or flexible sigmoidoscopy):  Excessive amounts of blood in the stool  Significant tenderness or worsening of abdominal pains  Swelling of the abdomen that is new, acute  Fever of 100F or higher  For urgent or emergent issues, a gastroenterologist can be reached at any hour by calling [phone].   DIET:  We do recommend a small meal at first, but then you may proceed to your regular diet.  Drink plenty of fluids but you should avoid alcoholic beverages for 24 hours.  ACTIVITY:  You should plan to take it easy for the rest of today and you should NOT DRIVE or use heavy machinery until tomorrow  (because of the sedation medicines used during the test).    FOLLOW UP: Our staff will call the number listed on your records the next business day following your procedure to check on you and address any questions or concerns that you may have regarding the information given to you following your procedure. If we do not reach you, we will leave a message.  However, if you are feeling well and you are not experiencing any problems, there is no need to return our call.  We will assume that you have returned to your regular daily activities without incident.  If any biopsies were taken you will be contacted by phone or by letter within the next 1-3 weeks.  Please call us at [phone] if you have not heard about the biopsies in 3 weeks.    SIGNATURES/CONFIDENTIALITY: You and/or your care partner have signed paperwork which will be entered into your electronic medical record.  These signatures attest to the fact that that the information above on your After Visit Summary has been reviewed and is understood.  Full responsibility of the confidentiality of this discharge information lies with you and/or your care-partner.

## 2017-11-22 NOTE — Progress Notes (Signed)
A and O x3. Report to RN. Tolerated MAC anesthesia well.

## 2017-11-25 ENCOUNTER — Telehealth: Payer: Self-pay

## 2017-11-25 NOTE — Telephone Encounter (Signed)
  Follow up Call-  Call back number 11/22/2017  Post procedure Call Back phone  # [phone]  Permission to leave phone message Yes  Some recent data might be hidden     Patient questions:  Do you have a fever, pain , or abdominal swelling? No. Pain Score  0 *  Have you tolerated food without any problems? Yes.    Have you been able to return to your normal activities? Yes.    Do you have any questions about your discharge instructions: Diet   No. Medications  No. Follow up visit  No.  Do you have questions or concerns about your Care? No.  Actions: * If pain score is 4 or above: No action needed, pain <4.

## 2017-12-02 ENCOUNTER — Encounter: Payer: Self-pay | Admitting: Gastroenterology

## 2018-02-28 DIAGNOSIS — L570 Actinic keratosis: Secondary | ICD-10-CM | POA: Diagnosis not present

## 2018-02-28 DIAGNOSIS — D485 Neoplasm of uncertain behavior of skin: Secondary | ICD-10-CM | POA: Diagnosis not present

## 2018-02-28 DIAGNOSIS — L821 Other seborrheic keratosis: Secondary | ICD-10-CM | POA: Diagnosis not present

## 2018-03-25 NOTE — Progress Notes (Signed)
Chief Complaint  Patient presents with  . Annual Exam    Pt states she sometimes she feels likes she gets pressure on her chest that is intermittenly and also pain on right lower abdomen     HPI: Patient  Bridget Elliott  63 y.o. comes in today for [facility] visit  Is well no reg meds  Sees gyne routine  She is generally well but wonders about some transient lasting minutes mid chest pressure that goes intermittently without associated nausea vomiting shortness of breath and not associated with exercise.  She has had 2 friends die suddenly of heart attacks recently. She also has an intermittent right lower abdominal twinging pain but no associated symptoms.  Exercises regularly walking without associated chest pain shortness of breath neck pain syncope or numbness. Family history is positive for heart attacks in both grandparents mom had atherosclerosis but did not die of heart disease or have heart attacks she was overweight and smoked.  Burps occasionally but no specific heartburn.  No known insider's or aggravators and it is not severe.     Health Maintenance  Topic Date Due  . HIV Screening  07/29/1971  . TETANUS/TDAP  07/29/1975  . PAP SMEAR-Modifier  07/05/2017  . INFLUENZA VACCINE  04/29/2018 (Originally 08/29/2017)  . MAMMOGRAM  06/14/2019  . COLONOSCOPY  11/23/2027  . Hepatitis C Screening  Completed   Health Maintenance Review LIFESTYLE:  Exercise:  3-4 miles  Tobacco/ETS:   no never smoked but parents smoke positive ETS when younger. Alcohol:  travelion  1-2 per night  Some on  weekends . Sugar beverages:no Sleep:7-8  Drug use: no HH of  2   Pet dog  Work:scheduled hours  Husband business  4 per day and sit grandkids  Mom atherosclerosis   But no mi gf had mis .  ROS:  GEN/ HEENT: No fever, significant weight changes sweats headaches vision problems hearing changes, CV/ PULM; Noshortness of breath cough, syncope,edema  change in exercise  tolerance. GI /GU: No adominal pain, vomiting, change in bowel habits. No blood in the stool. No significant GU symptoms. SKIN/HEME: ,no acute skin rashes suspicious lesions or bleeding. No lymphadenopathy, nodules, masses.  NEURO/ PSYCH:  No neurologic signs such as weakness numbness. No depression anxiety. IMM/ Allergy: No unusual infections.  Allergy .   REST of 12 system review negative except as per HPI   Past Medical History:  Diagnosis Date  . ANEMIA 03/21/2007   Qualifier: History of  By: Danny Lawless CMA, [place]    . Hx of varicella   . 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 Scarlet fever 03/21/2007   Qualifier: History of  By: Danny Lawless CMA, [place]    . UTI (urinary tract infection) 10/22/2011    Past Surgical History:  Procedure Laterality Date  . COLONOSCOPY  1841  . kidney stone removal    . left calf surgery     x2    . TONSILLECTOMY      Family History  Problem Relation Age of Onset  . Cancer Mother   . Hypertension Mother   . Hyperlipidemia Mother   . Cancer Father        Lung,esophageal  . Esophageal cancer Father   . Heart attack Maternal Grandfather   . Heart attack Paternal Grandfather   . Colon cancer Neg Hx   . Colon polyps Neg Hx   . Rectal cancer Neg Hx   . Stomach  cancer Neg Hx   . Pancreatic cancer Neg Hx     Social History   Socioeconomic History  . Marital status: Married    Spouse name: Not on file  . Number of children: Not on file  . Years of education: Not on file  . Highest education level: Not on file  Occupational History  . Not on file  Social Needs  . Financial resource strain: Not on file  . Food insecurity:    Worry: Not on file    Inability: Not on file  . Transportation needs:    Medical: Not on file    Non-medical: Not on file  Tobacco Use  . Smoking status: Never Smoker  . Smokeless tobacco: Never Used  Substance and Sexual Activity  . Alcohol use: Yes    Alcohol/week: 0.0 standard drinks    Comment: 5  glasses of wine a week  . Drug use: No  . Sexual activity: Yes    Partners: Male  Lifestyle  . Physical activity:    Days per week: Not on file    Minutes per session: Not on file  . Stress: Not on file  Relationships  . Social connections:    Talks on phone: Not on file    Gets together: Not on file    Attends religious service: Not on file    Active member of club or organization: Not on file    Attends meetings of clubs or organizations: Not on file    Relationship status: Not on file  Other Topics Concern  . Not on file  Social History Narrative   7 hours of sleep per night   Does not work outside the home   Lives alone with her husband   2 Cats in the home   G4P4   LIFESTYLE:    Exercise:     Tobacco/ETS:no   Alcohol: per day  0 - 5 per week    Sugar beverages:   Sleep: 7 hours    Drug use: no    Outpatient Medications Prior to Visit  Medication Sig Dispense Refill  . cholecalciferol (VITAMIN D) 1000 units tablet Take 2,000 Units by mouth daily.    Kendall Flack 575 MG/5ML SYRP Take by mouth.    . Multiple Vitamins-Minerals (EMERGEN-C IMMUNE) PACK Take by mouth daily.     No facility-administered medications prior to visit.      EXAM:  BP 118/72 (BP Location: Right Arm, Patient Position: Sitting, Cuff Size: Normal)   Pulse 76   Temp 98.2 F (36.8 C) (Oral)   Ht '5\' 3"'$  (1.6 m)   Wt 142 lb 9.6 oz (64.7 kg)   BMI 25.26 kg/m   Body mass index is 25.26 kg/m. Wt Readings from Last 3 Encounters:  03/26/18 142 lb 9.6 oz (64.7 kg)  11/22/17 141 lb (64 kg)  11/08/17 141 lb (64 kg)    Physical Exam: Vital signs reviewed GNF:[id number] is a well-developed well-nourished alert cooperative    who appearsr stated age in no acute distress.  HEENT: normocephalic atraumatic , Eyes: PERRL EOM's full, conjunctiva clear, Nares: paten,t no deformity discharge or tenderness., Ears: no deformity EAC's clear TMs with normal landmarks. Mouth: clear OP, no lesions, edema.  Moist  mucous membranes. Dentition in adequate repair. NECK: supple without masses, thyromegaly or bruits. CHEST/PULM:  Clear to auscultation and percussion breath sounds equal no wheeze , rales or rhonchi. No chest wall deformities or tenderness. Breast: normal by inspection . No dimpling,  discharge, masses, tenderness or discharge . CV: PMI is nondisplaced, S1 S2 no gallops, murmurs, rubs. Peripheral pulses are full without delay.No JVD .  ABDOMEN: Bowel sounds normal nontender  No guard or rebound, no hepato splenomegal no CVA tenderness.  No hernia. Extremtities:  No clubbing cyanosis or edema, no acute joint swelling or redness no focal atrophy NEURO:  Oriented x3, cranial nerves 3-12 appear to be intact, no obvious focal weakness,gait within normal limits no abnormal reflexes or asymmetrical SKIN: No acute rashes normal turgor, color, no bruising or petechiae. PSYCH: Oriented, good eye contact, no obvious depression anxiety, cognition and judgment appear normal. LN: no cervical axillary inguinal adenopathy  Lab Results  Component Value Date   WBC 4.9 03/26/2018   HGB 14.7 03/26/2018   HCT 43.0 03/26/2018   PLT 295.0 03/26/2018   GLUCOSE 86 03/26/2018   CHOL 235 (H) 03/26/2018   TRIG 62.0 03/26/2018   HDL 80.70 03/26/2018   LDLDIRECT 146.9 09/20/2009   LDLCALC 142 (H) 03/26/2018   ALT 26 03/26/2018   AST 23 03/26/2018   NA 139 03/26/2018   K 4.0 03/26/2018   CL 100 03/26/2018   CREATININE 0.71 03/26/2018   BUN 16 03/26/2018   CO2 32 03/26/2018   TSH 1.18 03/26/2018    BP Readings from Last 3 Encounters:  03/26/18 118/72  11/22/17 130/78  09/04/16 140/82   7 this am  Protein  Shake  Lab plan  reviewed with patient  ekg p wave uln no acute  Or ischemic changes   The 10-year ASCVD risk score Mikey Bussing DC Jr., et al., 2013) is: 2.8%   Values used to calculate the score:     Age: 58 years     Sex: Female     Is Non-Hispanic African American: No     Diabetic: No     Tobacco  smoker: No     Systolic Blood Pressure: 594 mmHg     Is BP treated: No     HDL Cholesterol: 80.7 mg/dL     Total Cholesterol: 235 mg/dL ekg nsr   pwave prominent poss lae ( but exam wnl)  ASSESSMENT AND PLAN:  Discussed the following assessment and plan:  Visit for preventive health examination - Plan: Basic metabolic panel, CBC with Differential/Platelet, Hepatic function panel, Lipid panel, TSH, CRP, High Sensitivity  Hyperlipidemia, unspecified hyperlipidemia type - Plan: Basic metabolic panel, CBC with Differential/Platelet, Hepatic function panel, Lipid panel, TSH, CRP, High Sensitivity, CT HEART W/O CONTRAST MEDIA - QUANT EVAL CORONARY CALCIUM, EXERCISE TOLERANCE TEST (ETT)  Sensation of chest pressure - Plan: Basic metabolic panel, CBC with Differential/Platelet, Hepatic function panel, Lipid panel, TSH, EKG 12-Lead, CRP, High Sensitivity, CT HEART W/O CONTRAST MEDIA - QUANT EVAL CORONARY CALCIUM, EXERCISE TOLERANCE TEST (ETT)  Family history of heart attack - Plan: CT HEART W/O CONTRAST MEDIA - QUANT EVAL CORONARY CALCIUM, EXERCISE TOLERANCE TEST (ETT)  Cardiac risk counseling - Plan: CT HEART W/O CONTRAST MEDIA - QUANT EVAL CORONARY CALCIUM, EXERCISE TOLERANCE TEST (ETT) Chest pressure is short lived  Minutes and atypical without  assoc sx   But  Risk assessment reasonable  ? Poet cac scan after lipids checked    Pt asks about hscrp     So added on but not a triage point.    Discussed that this is probably not cardiac in origin but risk assessment and some evaluation is probably in order Check her lipids today consider p.o. ET coronary artery calcium scan to assess if  she is a candidate for intervention or statin medicine. Patient Care Team: Ashyia Schraeder, Standley Brooking, MD as PCP - General (Internal Medicine) Molli Posey, MD as Consulting Physician (Obstetrics and Gynecology) [facility] (Ophthalmology) Jari Pigg, MD as Consulting Physician (Dermatology) Patient  Instructions  Your exam is  Good today.  Chest pressure  Is atypical   And not  particularly cardiac pointing   ekg  Ok no acute changes .  But we can get  POET  And  Poss  coronary aretery calcium scan  To check plaque burden this is a non functional test.  But risk  Assessment  Consideration of  Adding statin medication.  Lets get labs   Health Maintenance, Female Adopting a healthy lifestyle and getting preventive care can go a long way to promote health and wellness. Talk with your health care provider about what schedule of regular examinations is right for you. This is a good chance for you to check in with your provider about disease prevention and staying healthy. In between checkups, there are plenty of things you can do on your own. Experts have done a lot of research about which lifestyle changes and preventive measures are most likely to keep you healthy. Ask your health care provider for more information. Weight and diet Eat a healthy diet  Be sure to include plenty of vegetables, fruits, low-fat dairy products, and lean protein.  Do not eat a lot of foods high in solid fats, added sugars, or salt.  Get regular exercise. This is one of the most important things you can do for your health. ? Most adults should exercise for at least 150 minutes each week. The exercise should increase your heart rate and make you sweat (moderate-intensity exercise). ? Most adults should also do strengthening exercises at least twice a week. This is in addition to the moderate-intensity exercise. Maintain a healthy weight  Body mass index (BMI) is a measurement that can be used to identify possible weight problems. It estimates body fat based on height and weight. Your health care provider can help determine your BMI and help you achieve or maintain a healthy weight.  For females 49 years of age and older: ? A BMI below 18.5 is considered underweight. ? A BMI of 18.5 to 24.9 is normal. ? A BMI of  25 to 29.9 is considered overweight. ? A BMI of 30 and above is considered obese. Watch levels of cholesterol and blood lipids  You should start having your blood tested for lipids and cholesterol at 62 years of age, then have this test every 5 years.  You may need to have your cholesterol levels checked more often if: ? Your lipid or cholesterol levels are high. ? You are older than 62 years of age. ? You are at high risk for heart disease. Cancer screening Lung Cancer  Lung cancer screening is recommended for adults 82-85 years old who are at high risk for lung cancer because of a history of smoking.  A yearly low-dose CT scan of the lungs is recommended for people who: ? Currently smoke. ? Have quit within the past 15 years. ? Have at least a 30-pack-year history of smoking. A pack year is smoking an average of one pack of cigarettes a day for 1 year.  Yearly screening should continue until it has been 15 years since you quit.  Yearly screening should stop if you develop a health problem that would prevent you from having lung cancer treatment. Breast  Cancer  Practice breast self-awareness. This means understanding how your breasts normally appear and feel.  It also means doing regular breast self-exams. Let your health care provider know about any changes, no matter how small.  If you are in your 20s or 30s, you should have a clinical breast exam (CBE) by a health care provider every 1-3 years as part of a regular health exam.  If you are 61 or older, have a CBE every year. Also consider having a breast X-ray (mammogram) every year.  If you have a family history of breast cancer, talk to your health care provider about genetic screening.  If you are at high risk for breast cancer, talk to your health care provider about having an MRI and a mammogram every year.  Breast cancer gene (BRCA) assessment is recommended for women who have family members with BRCA-related cancers.  BRCA-related cancers include: ? Breast. ? Ovarian. ? Tubal. ? Peritoneal cancers.  Results of the assessment will determine the need for genetic counseling and BRCA1 and BRCA2 testing. Cervical Cancer Your health care provider may recommend that you be screened regularly for cancer of the pelvic organs (ovaries, uterus, and vagina). This screening involves a pelvic examination, including checking for microscopic changes to the surface of your cervix (Pap test). You may be encouraged to have this screening done every 3 years, beginning at age 8.  For women ages 10-65, health care providers may recommend pelvic exams and Pap testing every 3 years, or they may recommend the Pap and pelvic exam, combined with testing for human papilloma virus (HPV), every 5 years. Some types of HPV increase your risk of cervical cancer. Testing for HPV may also be done on women of any age with unclear Pap test results.  Other health care providers may not recommend any screening for nonpregnant women who are considered low risk for pelvic cancer and who do not have symptoms. Ask your health care provider if a screening pelvic exam is right for you.  If you have had past treatment for cervical cancer or a condition that could lead to cancer, you need Pap tests and screening for cancer for at least 20 years after your treatment. If Pap tests have been discontinued, your risk factors (such as having a new sexual partner) need to be reassessed to determine if screening should resume. Some women have medical problems that increase the chance of getting cervical cancer. In these cases, your health care provider may recommend more frequent screening and Pap tests. Colorectal Cancer  This type of cancer can be detected and often prevented.  Routine colorectal cancer screening usually begins at 62 years of age and continues through 62 years of age.  Your health care provider may recommend screening at an earlier age if you  have risk factors for colon cancer.  Your health care provider may also recommend using home test kits to check for hidden blood in the stool.  A small camera at the end of a tube can be used to examine your colon directly (sigmoidoscopy or colonoscopy). This is done to check for the earliest forms of colorectal cancer.  Routine screening usually begins at age 86.  Direct examination of the colon should be repeated every 5-10 years through 62 years of age. However, you may need to be screened more often if early forms of precancerous polyps or small growths are found. Skin Cancer  Check your skin from head to toe regularly.  Tell your health care provider  about any new moles or changes in moles, especially if there is a change in a mole's shape or color.  Also tell your health care provider if you have a mole that is larger than the size of a pencil eraser.  Always use sunscreen. Apply sunscreen liberally and repeatedly throughout the day.  Protect yourself by wearing long sleeves, pants, a wide-brimmed hat, and sunglasses whenever you are outside. Heart disease, diabetes, and high blood pressure  High blood pressure causes heart disease and increases the risk of stroke. High blood pressure is more likely to develop in: ? People who have blood pressure in the high end of the normal range (130-139/85-89 mm Hg). ? People who are overweight or obese. ? People who are African American.  If you are 70-70 years of age, have your blood pressure checked every 3-5 years. If you are 92 years of age or older, have your blood pressure checked every year. You should have your blood pressure measured twice-once when you are at a hospital or clinic, and once when you are not at a hospital or clinic. Record the average of the two measurements. To check your blood pressure when you are not at a hospital or clinic, you can use: ? An automated blood pressure machine at a pharmacy. ? A home blood pressure  monitor.  If you are between 14 years and 57 years old, ask your health care provider if you should take aspirin to prevent strokes.  Have regular diabetes screenings. This involves taking a blood sample to check your fasting blood sugar level. ? If you are at a normal weight and have a low risk for diabetes, have this test once every three years after 62 years of age. ? If you are overweight and have a high risk for diabetes, consider being tested at a younger age or more often. Preventing infection Hepatitis B  If you have a higher risk for hepatitis B, you should be screened for this virus. You are considered at high risk for hepatitis B if: ? You were born in a country where hepatitis B is common. Ask your health care provider which countries are considered high risk. ? Your parents were born in a high-risk country, and you have not been immunized against hepatitis B (hepatitis B vaccine). ? You have HIV or AIDS. ? You use needles to inject street drugs. ? You live with someone who has hepatitis B. ? You have had sex with someone who has hepatitis B. ? You get hemodialysis treatment. ? You take certain medicines for conditions, including cancer, organ transplantation, and autoimmune conditions. Hepatitis C  Blood testing is recommended for: ? Everyone born from [birth year] through 1964. ? Anyone with known risk factors for hepatitis C. Sexually transmitted infections (STIs)  You should be screened for sexually transmitted infections (STIs) including gonorrhea and chlamydia if: ? You are sexually active and are younger than 62 years of age. ? You are older than 63 years of age and your health care provider tells you that you are at risk for this type of infection. ? Your sexual activity has changed since you were last screened and you are at an increased risk for chlamydia or gonorrhea. Ask your health care provider if you are at risk.  If you do not have HIV, but are at risk, it may be  recommended that you take a prescription medicine daily to prevent HIV infection. This is called pre-exposure prophylaxis (PrEP). You are considered at risk  if: ? You are sexually active and do not regularly use condoms or know the HIV status of your partner(s). ? You take drugs by injection. ? You are sexually active with a partner who has HIV. Talk with your health care provider about whether you are at high risk of being infected with HIV. If you choose to begin PrEP, you should first be tested for HIV. You should then be tested every 3 months for as long as you are taking PrEP. Pregnancy  If you are premenopausal and you may become pregnant, ask your health care provider about preconception counseling.  If you may become pregnant, take 400 to 800 micrograms (mcg) of folic acid every day.  If you want to prevent pregnancy, talk to your health care provider about birth control (contraception). Osteoporosis and menopause  Osteoporosis is a disease in which the bones lose minerals and strength with aging. This can result in serious bone fractures. Your risk for osteoporosis can be identified using a bone density scan.  If you are 9 years of age or older, or if you are at risk for osteoporosis and fractures, ask your health care provider if you should be screened.  Ask your health care provider whether you should take a calcium or vitamin D supplement to lower your risk for osteoporosis.  Menopause may have certain physical symptoms and risks.  Hormone replacement therapy may reduce some of these symptoms and risks. Talk to your health care provider about whether hormone replacement therapy is right for you. Follow these instructions at home:  Schedule regular health, dental, and eye exams.  Stay current with your immunizations.  Do not use any tobacco products including cigarettes, chewing tobacco, or electronic cigarettes.  If you are pregnant, do not drink alcohol.  If you are  breastfeeding, limit how much and how often you drink alcohol.  Limit alcohol intake to no more than 1 drink per day for nonpregnant women. One drink equals 12 ounces of beer, 5 ounces of wine, or 1 ounces of hard liquor.  Do not use street drugs.  Do not share needles.  Ask your health care provider for help if you need support or information about quitting drugs.  Tell your health care provider if you often feel depressed.  Tell your health care provider if you have ever been abused or do not feel safe at home. This information is not intended to replace advice given to you by your health care provider. Make sure you discuss any questions you have with your health care provider. Document Released: 07/31/2010 Document Revised: 06/23/2015 Document Reviewed: 10/19/2014 Elsevier Interactive Patient Education  2019 [place]. Aimie Wagman M.D.

## 2018-03-26 ENCOUNTER — Encounter: Payer: Self-pay | Admitting: Internal Medicine

## 2018-03-26 ENCOUNTER — Ambulatory Visit (INDEPENDENT_AMBULATORY_CARE_PROVIDER_SITE_OTHER): Payer: BLUE CROSS/BLUE SHIELD | Admitting: Internal Medicine

## 2018-03-26 VITALS — BP 118/72 | HR 76 | Temp 98.2°F | Ht 63.0 in | Wt 142.6 lb

## 2018-03-26 DIAGNOSIS — Z8249 Family history of ischemic heart disease and other diseases of the circulatory system: Secondary | ICD-10-CM

## 2018-03-26 DIAGNOSIS — Z7189 Other specified counseling: Secondary | ICD-10-CM

## 2018-03-26 DIAGNOSIS — E785 Hyperlipidemia, unspecified: Secondary | ICD-10-CM

## 2018-03-26 DIAGNOSIS — Z0000 Encounter for general adult medical examination without abnormal findings: Secondary | ICD-10-CM

## 2018-03-26 DIAGNOSIS — R0789 Other chest pain: Secondary | ICD-10-CM | POA: Diagnosis not present

## 2018-03-26 LAB — LIPID PANEL
CHOLESTEROL: 235 mg/dL — AB (ref 0–200)
HDL: 80.7 mg/dL (ref >39.00)
LDL Cholesterol: 142 mg/dL — ABNORMAL HIGH (ref 0–99)
NonHDL: 154.01
Total CHOL/HDL Ratio: 3
Triglycerides: 62 mg/dL (ref 0.0–149.0)
VLDL: 12.4 mg/dL (ref 0.0–40.0)

## 2018-03-26 LAB — HEPATIC FUNCTION PANEL
ALBUMIN: 4.8 g/dL (ref 3.5–5.2)
ALT: 26 U/L (ref 0–35)
AST: 23 U/L (ref 0–37)
Alkaline Phosphatase: 78 U/L (ref 39–117)
Bilirubin, Direct: 0.1 mg/dL (ref 0.0–0.3)
Total Bilirubin: 0.6 mg/dL (ref 0.2–1.2)
Total Protein: 7.3 g/dL (ref 6.0–8.3)

## 2018-03-26 LAB — CBC WITH DIFFERENTIAL/PLATELET
BASOS PCT: 0.8 % (ref 0.0–3.0)
Basophils Absolute: 0 10*3/uL (ref 0.0–0.1)
EOS PCT: 1 % (ref 0.0–5.0)
Eosinophils Absolute: 0 10*3/uL (ref 0.0–0.7)
HCT: 43 % (ref 36.0–46.0)
Hemoglobin: 14.7 g/dL (ref 12.0–15.0)
LYMPHS ABS: 1.5 10*3/uL (ref 0.7–4.0)
Lymphocytes Relative: 30.9 % (ref 12.0–46.0)
MCHC: 34.2 g/dL (ref 30.0–36.0)
MCV: 93 fl (ref 78.0–100.0)
MONOS PCT: 12.7 % — AB (ref 3.0–12.0)
Monocytes Absolute: 0.6 10*3/uL (ref 0.1–1.0)
NEUTROS PCT: 54.6 % (ref 43.0–77.0)
Neutro Abs: 2.7 10*3/uL (ref 1.4–7.7)
PLATELETS: 295 10*3/uL (ref 150.0–400.0)
RBC: 4.63 Mil/uL (ref 3.87–5.11)
RDW: 13.5 % (ref 11.5–15.5)
WBC: 4.9 10*3/uL (ref 4.0–10.5)

## 2018-03-26 LAB — BASIC METABOLIC PANEL
BUN: 16 mg/dL (ref 6–23)
CO2: 32 mEq/L (ref 19–32)
Calcium: 9.7 mg/dL (ref 8.4–10.5)
Chloride: 100 mEq/L (ref 96–112)
Creatinine, Ser: 0.71 mg/dL (ref 0.40–1.20)
GFR: 83.51 mL/min (ref >60.00)
GLUCOSE: 86 mg/dL (ref 70–99)
Potassium: 4 mEq/L (ref 3.5–5.1)
SODIUM: 139 meq/L (ref 135–145)

## 2018-03-26 LAB — HIGH SENSITIVITY CRP: CRP, High Sensitivity: 2.78 mg/L (ref 0.000–5.000)

## 2018-03-26 NOTE — Patient Instructions (Addendum)
Your exam is  Good today.  Chest pressure  Is atypical   And not  particularly cardiac pointing   ekg  Ok no acute changes .  But we can get  POET  And  Poss  coronary aretery calcium scan  To check plaque burden this is a non functional test.  But risk  Assessment  Consideration of  Adding statin medication.  Lets get labs   Health Maintenance, Female Adopting a healthy lifestyle and getting preventive care can go a long way to promote health and wellness. Talk with your health care provider about what schedule of regular examinations is right for you. This is a good chance for you to check in with your provider about disease prevention and staying healthy. In between checkups, there are plenty of things you can do on your own. Experts have done a lot of research about which lifestyle changes and preventive measures are most likely to keep you healthy. Ask your health care provider for more information. Weight and diet Eat a healthy diet  Be sure to include plenty of vegetables, fruits, low-fat dairy products, and lean protein.  Do not eat a lot of foods high in solid fats, added sugars, or salt.  Get regular exercise. This is one of the most important things you can do for your health. ? Most adults should exercise for at least 150 minutes each week. The exercise should increase your heart rate and make you sweat (moderate-intensity exercise). ? Most adults should also do strengthening exercises at least twice a week. This is in addition to the moderate-intensity exercise. Maintain a healthy weight  Body mass index (BMI) is a measurement that can be used to identify possible weight problems. It estimates body fat based on height and weight. Your health care provider can help determine your BMI and help you achieve or maintain a healthy weight.  For females 18 years of age and older: ? A BMI below 18.5 is considered underweight. ? A BMI of 18.5 to 24.9 is normal. ? A BMI of 25 to 29.9 is  considered overweight. ? A BMI of 30 and above is considered obese. Watch levels of cholesterol and blood lipids  You should start having your blood tested for lipids and cholesterol at 62 years of age, then have this test every 5 years.  You may need to have your cholesterol levels checked more often if: ? Your lipid or cholesterol levels are high. ? You are older than 62 years of age. ? You are at high risk for heart disease. Cancer screening Lung Cancer  Lung cancer screening is recommended for adults 39-60 years old who are at high risk for lung cancer because of a history of smoking.  A yearly low-dose CT scan of the lungs is recommended for people who: ? Currently smoke. ? Have quit within the past 15 years. ? Have at least a 30-pack-year history of smoking. A pack year is smoking an average of one pack of cigarettes a day for 1 year.  Yearly screening should continue until it has been 15 years since you quit.  Yearly screening should stop if you develop a health problem that would prevent you from having lung cancer treatment. Breast Cancer  Practice breast self-awareness. This means understanding how your breasts normally appear and feel.  It also means doing regular breast self-exams. Let your health care provider know about any changes, no matter how small.  If you are in your 20s or 30s,  you should have a clinical breast exam (CBE) by a health care provider every 1-3 years as part of a regular health exam.  If you are 79 or older, have a CBE every year. Also consider having a breast X-ray (mammogram) every year.  If you have a family history of breast cancer, talk to your health care provider about genetic screening.  If you are at high risk for breast cancer, talk to your health care provider about having an MRI and a mammogram every year.  Breast cancer gene (BRCA) assessment is recommended for women who have family members with BRCA-related cancers. BRCA-related  cancers include: ? Breast. ? Ovarian. ? Tubal. ? Peritoneal cancers.  Results of the assessment will determine the need for genetic counseling and BRCA1 and BRCA2 testing. Cervical Cancer Your health care provider may recommend that you be screened regularly for cancer of the pelvic organs (ovaries, uterus, and vagina). This screening involves a pelvic examination, including checking for microscopic changes to the surface of your cervix (Pap test). You may be encouraged to have this screening done every 3 years, beginning at age 108.  For women ages 33-65, health care providers may recommend pelvic exams and Pap testing every 3 years, or they may recommend the Pap and pelvic exam, combined with testing for human papilloma virus (HPV), every 5 years. Some types of HPV increase your risk of cervical cancer. Testing for HPV may also be done on women of any age with unclear Pap test results.  Other health care providers may not recommend any screening for nonpregnant women who are considered low risk for pelvic cancer and who do not have symptoms. Ask your health care provider if a screening pelvic exam is right for you.  If you have had past treatment for cervical cancer or a condition that could lead to cancer, you need Pap tests and screening for cancer for at least 20 years after your treatment. If Pap tests have been discontinued, your risk factors (such as having a new sexual partner) need to be reassessed to determine if screening should resume. Some women have medical problems that increase the chance of getting cervical cancer. In these cases, your health care provider may recommend more frequent screening and Pap tests. Colorectal Cancer  This type of cancer can be detected and often prevented.  Routine colorectal cancer screening usually begins at 62 years of age and continues through 62 years of age.  Your health care provider may recommend screening at an earlier age if you have risk  factors for colon cancer.  Your health care provider may also recommend using home test kits to check for hidden blood in the stool.  A small camera at the end of a tube can be used to examine your colon directly (sigmoidoscopy or colonoscopy). This is done to check for the earliest forms of colorectal cancer.  Routine screening usually begins at age 41.  Direct examination of the colon should be repeated every 5-10 years through 62 years of age. However, you may need to be screened more often if early forms of precancerous polyps or small growths are found. Skin Cancer  Check your skin from head to toe regularly.  Tell your health care provider about any new moles or changes in moles, especially if there is a change in a mole's shape or color.  Also tell your health care provider if you have a mole that is larger than the size of a pencil eraser.  Always use  sunscreen. Apply sunscreen liberally and repeatedly throughout the day.  Protect yourself by wearing long sleeves, pants, a wide-brimmed hat, and sunglasses whenever you are outside. Heart disease, diabetes, and high blood pressure  High blood pressure causes heart disease and increases the risk of stroke. High blood pressure is more likely to develop in: ? People who have blood pressure in the high end of the normal range (130-139/85-89 mm Hg). ? People who are overweight or obese. ? People who are African American.  If you are 60-53 years of age, have your blood pressure checked every 3-5 years. If you are 22 years of age or older, have your blood pressure checked every year. You should have your blood pressure measured twice-once when you are at a hospital or clinic, and once when you are not at a hospital or clinic. Record the average of the two measurements. To check your blood pressure when you are not at a hospital or clinic, you can use: ? An automated blood pressure machine at a pharmacy. ? A home blood pressure  monitor.  If you are between 82 years and 35 years old, ask your health care provider if you should take aspirin to prevent strokes.  Have regular diabetes screenings. This involves taking a blood sample to check your fasting blood sugar level. ? If you are at a normal weight and have a low risk for diabetes, have this test once every three years after 62 years of age. ? If you are overweight and have a high risk for diabetes, consider being tested at a younger age or more often. Preventing infection Hepatitis B  If you have a higher risk for hepatitis B, you should be screened for this virus. You are considered at high risk for hepatitis B if: ? You were born in a country where hepatitis B is common. Ask your health care provider which countries are considered high risk. ? Your parents were born in a high-risk country, and you have not been immunized against hepatitis B (hepatitis B vaccine). ? You have HIV or AIDS. ? You use needles to inject street drugs. ? You live with someone who has hepatitis B. ? You have had sex with someone who has hepatitis B. ? You get hemodialysis treatment. ? You take certain medicines for conditions, including cancer, organ transplantation, and autoimmune conditions. Hepatitis C  Blood testing is recommended for: ? Everyone born from [birth year] through 1964. ? Anyone with known risk factors for hepatitis C. Sexually transmitted infections (STIs)  You should be screened for sexually transmitted infections (STIs) including gonorrhea and chlamydia if: ? You are sexually active and are younger than 62 years of age. ? You are older than 62 years of age and your health care provider tells you that you are at risk for this type of infection. ? Your sexual activity has changed since you were last screened and you are at an increased risk for chlamydia or gonorrhea. Ask your health care provider if you are at risk.  If you do not have HIV, but are at risk, it may be  recommended that you take a prescription medicine daily to prevent HIV infection. This is called pre-exposure prophylaxis (PrEP). You are considered at risk if: ? You are sexually active and do not regularly use condoms or know the HIV status of your partner(s). ? You take drugs by injection. ? You are sexually active with a partner who has HIV. Talk with your health care provider about whether  you are at high risk of being infected with HIV. If you choose to begin PrEP, you should first be tested for HIV. You should then be tested every 3 months for as long as you are taking PrEP. Pregnancy  If you are premenopausal and you may become pregnant, ask your health care provider about preconception counseling.  If you may become pregnant, take 400 to 800 micrograms (mcg) of folic acid every day.  If you want to prevent pregnancy, talk to your health care provider about birth control (contraception). Osteoporosis and menopause  Osteoporosis is a disease in which the bones lose minerals and strength with aging. This can result in serious bone fractures. Your risk for osteoporosis can be identified using a bone density scan.  If you are 69 years of age or older, or if you are at risk for osteoporosis and fractures, ask your health care provider if you should be screened.  Ask your health care provider whether you should take a calcium or vitamin D supplement to lower your risk for osteoporosis.  Menopause may have certain physical symptoms and risks.  Hormone replacement therapy may reduce some of these symptoms and risks. Talk to your health care provider about whether hormone replacement therapy is right for you. Follow these instructions at home:  Schedule regular health, dental, and eye exams.  Stay current with your immunizations.  Do not use any tobacco products including cigarettes, chewing tobacco, or electronic cigarettes.  If you are pregnant, do not drink alcohol.  If you are  breastfeeding, limit how much and how often you drink alcohol.  Limit alcohol intake to no more than 1 drink per day for nonpregnant women. One drink equals 12 ounces of beer, 5 ounces of wine, or 1 ounces of hard liquor.  Do not use street drugs.  Do not share needles.  Ask your health care provider for help if you need support or information about quitting drugs.  Tell your health care provider if you often feel depressed.  Tell your health care provider if you have ever been abused or do not feel safe at home. This information is not intended to replace advice given to you by your health care provider. Make sure you discuss any questions you have with your health care provider. Document Released: 07/31/2010 Document Revised: 06/23/2015 Document Reviewed: 10/19/2014 Elsevier Interactive Patient Education  2019 [employer].

## 2018-03-27 LAB — TSH: TSH: 1.18 u[IU]/mL (ref 0.35–4.50)

## 2018-04-01 ENCOUNTER — Other Ambulatory Visit: Payer: Self-pay

## 2018-04-01 ENCOUNTER — Telehealth: Payer: Self-pay | Admitting: Internal Medicine

## 2018-04-01 NOTE — Telephone Encounter (Signed)
Copied from [place] [id number]. Topic: General - Other >> Apr 01, 2018  4:02 PM Keene Breath wrote: Reason for CRM: Patient called to request that the nurse or doctor give her a call regarding a message she saw on my chart regarding a test that needs to be done.  Please advise and call patient back at [phone]

## 2018-04-02 NOTE — Telephone Encounter (Signed)
Spoke with pt and cleared up what test was ordered

## 2018-04-09 DIAGNOSIS — Z01419 Encounter for gynecological examination (general) (routine) without abnormal findings: Secondary | ICD-10-CM | POA: Diagnosis not present

## 2018-04-09 DIAGNOSIS — Z6823 Body mass index (BMI) 23.0-23.9, adult: Secondary | ICD-10-CM | POA: Diagnosis not present

## 2018-04-17 ENCOUNTER — Telehealth: Payer: Self-pay

## 2018-04-17 NOTE — Telephone Encounter (Signed)
After reviewing patient's chart Dr. Johnsie Cancel recommends patient's GXT  be rescheduled at 6 to 8 weeks from original appointment, due to COVID 19 precautions. [facility] will call patient to reschedule.

## 2018-04-21 NOTE — Telephone Encounter (Signed)
GXT resc to 06-04-18.

## 2018-09-11 ENCOUNTER — Other Ambulatory Visit: Payer: Self-pay | Admitting: Obstetrics and Gynecology

## 2018-09-11 DIAGNOSIS — Z1231 Encounter for screening mammogram for malignant neoplasm of breast: Secondary | ICD-10-CM

## 2018-09-22 NOTE — Progress Notes (Signed)
Chief Complaint  Patient presents with  . Pain    pt states she has joint pain states that it started 6 to 8 weeks it started in her left hand index finger and has now spread to hands and ankles and knees     HPI: [facility] 63 y.[facility]. come in for joint pain concerns   Right middle finger pain and swelling    awoke with pain and swelling and then  Hands stiff  Had trigger stuck finger.   Is right handed .  Then  On her reg walking  Noted  Ankles   and knees pain  And stiff  In am  No pre dating illness noted    knees warm.  Intermittently.   no fever.   Yesterday intermittent  Pain  For  Severe back pain lower  ? Could it be a stone  Last pv 2 20   In [place] early Jan   Had mild illness  Sweats .  Family hx  Of   OA .  Hx of tick bite  [place]   But no rash x around bite  Has pics  Asks about poss lyme disease    Stiffness  Also   ROS: See pertinent positives and negatives per HPI. No cp sob neuro sx cough   New rash  No hx of psoriasis  Past Medical History:  Diagnosis Date  . ANEMIA 03/21/2007   Qualifier: History of  By: Danny Lawless CMA, [place]    . Hx of varicella   . 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 Scarlet fever 03/21/2007   Qualifier: History of  By: Danny Lawless CMA, [place]    . UTI (urinary tract infection) 10/22/2011    Family History  Problem Relation Age of Onset  . Cancer Mother   . Hypertension Mother   . Hyperlipidemia Mother   . Cancer Father        Lung,esophageal  . Esophageal cancer Father   . Heart attack Maternal Grandfather   . Heart attack Paternal Grandfather   . Colon cancer Neg Hx   . Colon polyps Neg Hx   . Rectal cancer Neg Hx   . Stomach cancer Neg Hx   . Pancreatic cancer Neg Hx     Social History   Socioeconomic History  . Marital status: Married    Spouse name: Not on file  . Number of children: Not on file  . Years of education: Not on file  . Highest education level: Not on file  Occupational History  . Not on  file  Social Needs  . Financial resource strain: Not on file  . Food insecurity    Worry: Not on file    Inability: Not on file  . Transportation needs    Medical: Not on file    Non-medical: Not on file  Tobacco Use  . Smoking status: Never Smoker  . Smokeless tobacco: Never Used  Substance and Sexual Activity  . Alcohol use: Yes    Alcohol/week: 0.0 standard drinks    Comment: 5 glasses of wine a week  . Drug use: No  . Sexual activity: Yes    Partners: Male  Lifestyle  . Physical activity    Days per week: Not on file    Minutes per session: Not on file  . Stress: Not on file  Relationships  . Social connections    Talks on phone: Not on file  Gets together: Not on file    Attends religious service: Not on file    Active member of club or organization: Not on file    Attends meetings of clubs or organizations: Not on file    Relationship status: Not on file  Other Topics Concern  . Not on file  Social History Narrative   7 hours of sleep per night   Does not work outside the home   Lives alone with her husband   2 Cats in the home   G4P4   LIFESTYLE:    Exercise:     Tobacco/ETS:no   Alcohol: per day  0 - 5 per week    Sugar beverages:   Sleep: 7 hours    Drug use: no    Outpatient Medications Prior to Visit  Medication Sig Dispense Refill  . cholecalciferol (VITAMIN D) 1000 units tablet Take 2,000 Units by mouth daily.    Kendall Flack 575 MG/5ML SYRP Take by mouth.    . Multiple Vitamins-Minerals (EMERGEN-C IMMUNE) PACK Take by mouth daily.     No facility-administered medications prior to visit.      EXAM:  BP 120/64 (BP Location: Right Arm, Patient Position: Sitting, Cuff Size: Normal)   Pulse 88   Temp (!) 97.3 F (36.3 C) (Temporal)   Wt 142 lb 3.2 oz (64.5 kg)   SpO2 97%   BMI 25.19 kg/m   Body mass index is 25.19 kg/m.  GENERAL: vitals reviewed and listed above, alert, oriented, appears well hydrated and in no acute distress HEENT:  atraumatic, conjunctiva  clear, no obvious abnormalities on inspection of external nose and ears tmx clear  OP : masked  NECK: no obvious masses on inspection palpation  LUNGS: clear to auscultation bilaterally, no wheezes, rales or rhonchi, good air movement CV: HRRR, no clubbing cyanosis or  peripheral edema nl cap refill  Abdomen:  Sof,t normal bowel sounds without hepatosplenomegaly, no guarding rebound or masses no CVA tenderness MS: moves all extremities right middle finger  Sausage like but  Ok rom  No effusion otherwiase PSYCH: pleasant and cooperative, no obvious depression or anxiety Lab Results  Component Value Date   WBC 5.8 09/23/2018   HGB 15.1 (H) 09/23/2018   HCT 44.5 09/23/2018   PLT 308.0 09/23/2018   GLUCOSE 99 09/23/2018   CHOL 235 (H) 03/26/2018   TRIG 62.0 03/26/2018   HDL 80.70 03/26/2018   LDLDIRECT 146.9 09/20/2009   LDLCALC 142 (H) 03/26/2018   ALT 21 09/23/2018   AST 18 09/23/2018   NA 140 09/23/2018   K 4.3 09/23/2018   CL 103 09/23/2018   CREATININE 0.67 09/23/2018   BUN 16 09/23/2018   CO2 29 09/23/2018   TSH 1.18 03/26/2018   BP Readings from Last 3 Encounters:  09/23/18 120/64  03/26/18 118/72  11/22/17 130/78    ASSESSMENT AND PLAN:  Discussed the following assessment and plan:  Pain in joint, multiple sites - Plan: POCT Urinalysis Dipstick (Automated), CBC with Differential/Platelet, CMP, Sedimentation rate, ANA, Cyclic citrul peptide antibody, IgG, Parvovirus B19 antibody, IgG and IgM, Uric Acid, Lyme Ab/Western Blot Reflex, Rheumatoid Factor, C-reactive protein, SAR CoV2 Serology (COVID 19)AB(IGG)IA, CANCELED: DG Hand 2 View Right  Morning joint stiffness of hand, unspecified laterality - Plan: POCT Urinalysis Dipstick (Automated), CBC with Differential/Platelet, CMP, Sedimentation rate, ANA, Cyclic citrul peptide antibody, IgG, Parvovirus B19 antibody, IgG and IgM, Uric Acid, Lyme Ab/Western Blot Reflex, Rheumatoid Factor, C-reactive  protein, SAR CoV2 Serology (COVID 19)AB(IGG)IA,  CANCELED: DG Hand 2 View Right  Swelling of right middle finger - Plan: CANCELED: DG Hand 2 View Right  -Patient advised to return or notify health care team  if  new concerns arise. In interim  Patient Instructions  Lab today this could be reactive  Arthritis  And can resolve   Checking for other causes  If  persistent or progressive without dx consider seeing rheumatology     Standley Brooking. Panosh M.D.

## 2018-09-23 ENCOUNTER — Encounter: Payer: Self-pay | Admitting: Internal Medicine

## 2018-09-23 ENCOUNTER — Ambulatory Visit (INDEPENDENT_AMBULATORY_CARE_PROVIDER_SITE_OTHER): Payer: Self-pay | Admitting: Internal Medicine

## 2018-09-23 ENCOUNTER — Other Ambulatory Visit: Payer: Self-pay

## 2018-09-23 ENCOUNTER — Other Ambulatory Visit: Payer: Self-pay | Admitting: Internal Medicine

## 2018-09-23 ENCOUNTER — Ambulatory Visit (INDEPENDENT_AMBULATORY_CARE_PROVIDER_SITE_OTHER): Payer: Self-pay

## 2018-09-23 VITALS — BP 120/64 | HR 88 | Temp 97.3°F | Wt 142.2 lb

## 2018-09-23 DIAGNOSIS — M2550 Pain in unspecified joint: Secondary | ICD-10-CM

## 2018-09-23 DIAGNOSIS — M25649 Stiffness of unspecified hand, not elsewhere classified: Secondary | ICD-10-CM

## 2018-09-23 DIAGNOSIS — M7989 Other specified soft tissue disorders: Secondary | ICD-10-CM

## 2018-09-23 LAB — COMPREHENSIVE METABOLIC PANEL
ALT: 21 U/L (ref 0–35)
AST: 18 U/L (ref 0–37)
Albumin: 4.9 g/dL (ref 3.5–5.2)
Alkaline Phosphatase: 82 U/L (ref 39–117)
BUN: 16 mg/dL (ref 6–23)
CO2: 29 mEq/L (ref 19–32)
Calcium: 9.9 mg/dL (ref 8.4–10.5)
Chloride: 103 mEq/L (ref 96–112)
Creatinine, Ser: 0.67 mg/dL (ref 0.40–1.20)
GFR: 89.14 mL/min (ref >60.00)
Glucose, Bld: 99 mg/dL (ref 70–99)
Potassium: 4.3 mEq/L (ref 3.5–5.1)
Sodium: 140 mEq/L (ref 135–145)
Total Bilirubin: 0.6 mg/dL (ref 0.2–1.2)
Total Protein: 7.1 g/dL (ref 6.0–8.3)

## 2018-09-23 LAB — CBC WITH DIFFERENTIAL/PLATELET
Basophils Absolute: 0 10*3/uL (ref 0.0–0.1)
Basophils Relative: 0.8 % (ref 0.0–3.0)
Eosinophils Absolute: 0.2 10*3/uL (ref 0.0–0.7)
Eosinophils Relative: 3.2 % (ref 0.0–5.0)
HCT: 44.5 % (ref 36.0–46.0)
Hemoglobin: 15.1 g/dL — ABNORMAL HIGH (ref 12.0–15.0)
Lymphocytes Relative: 33.8 % (ref 12.0–46.0)
Lymphs Abs: 2 10*3/uL (ref 0.7–4.0)
MCHC: 33.9 g/dL (ref 30.0–36.0)
MCV: 93.7 fl (ref 78.0–100.0)
Monocytes Absolute: 0.4 10*3/uL (ref 0.1–1.0)
Monocytes Relative: 7.4 % (ref 3.0–12.0)
Neutro Abs: 3.2 10*3/uL (ref 1.4–7.7)
Neutrophils Relative %: 54.8 % (ref 43.0–77.0)
Platelets: 308 10*3/uL (ref 150.0–400.0)
RBC: 4.75 Mil/uL (ref 3.87–5.11)
RDW: 13.4 % (ref 11.5–15.5)
WBC: 5.8 10*3/uL (ref 4.0–10.5)

## 2018-09-23 LAB — POC URINALSYSI DIPSTICK (AUTOMATED)
Bilirubin, UA: NEGATIVE
Blood, UA: NEGATIVE
Glucose, UA: NEGATIVE
Ketones, UA: NEGATIVE
Leukocytes, UA: NEGATIVE
Nitrite, UA: NEGATIVE
Protein, UA: POSITIVE — AB
Spec Grav, UA: 1.015 (ref 1.010–1.025)
Urobilinogen, UA: 0.2 E.U./dL
pH, UA: 7.5 (ref 5.0–8.0)

## 2018-09-23 LAB — C-REACTIVE PROTEIN: CRP: <1 mg/dL (ref 0.5–20.0)

## 2018-09-23 LAB — SEDIMENTATION RATE: Sed Rate: 6 mm/hr (ref 0–30)

## 2018-09-23 LAB — URIC ACID: Uric Acid, Serum: 4.6 mg/dL (ref 2.4–7.0)

## 2018-09-23 NOTE — Patient Instructions (Signed)
Lab today this could be reactive  Arthritis  And can resolve   Checking for other causes  If  persistent or progressive without dx consider seeing rheumatology

## 2018-09-26 LAB — RHEUMATOID FACTOR: Rheumatoid fact SerPl-aCnc: <14 IU/mL (ref <14)

## 2018-09-26 LAB — PARVOVIRUS B19 ANTIBODY, IGG AND IGM
Parvovirus B19 IgG: 2.4 — ABNORMAL HIGH (ref <0.9)
Parvovirus B19 IgM: 0.2 (ref <0.9)

## 2018-09-26 LAB — CYCLIC CITRUL PEPTIDE ANTIBODY, IGG: Cyclic Citrullin Peptide Ab: <16 UNITS

## 2018-09-26 LAB — ANA: Anti Nuclear Antibody (ANA): NEGATIVE

## 2018-09-26 LAB — SAR COV2 SEROLOGY (COVID19)AB(IGG),IA: SARS CoV2 AB IGG: NEGATIVE

## 2018-10-24 ENCOUNTER — Ambulatory Visit: Payer: BLUE CROSS/BLUE SHIELD

## 2018-11-04 ENCOUNTER — Telehealth: Payer: Self-pay | Admitting: *Deleted

## 2018-11-04 NOTE — Telephone Encounter (Signed)
Copied from [phone] [phone]. Topic: General - Inquiry >> Nov 04, 2018  3:56 PM Reyne Dumas L wrote: Reason for CRM:   Pt states that she is still waiting on pending test results for Lyme Disease.

## 2018-11-05 ENCOUNTER — Other Ambulatory Visit: Payer: Self-pay

## 2018-11-05 ENCOUNTER — Ambulatory Visit (INDEPENDENT_AMBULATORY_CARE_PROVIDER_SITE_OTHER): Payer: Self-pay | Admitting: Internal Medicine

## 2018-11-05 ENCOUNTER — Encounter: Payer: Self-pay | Admitting: Internal Medicine

## 2018-11-05 DIAGNOSIS — M2550 Pain in unspecified joint: Secondary | ICD-10-CM

## 2018-11-05 DIAGNOSIS — R8290 Unspecified abnormal findings in urine: Secondary | ICD-10-CM

## 2018-11-05 DIAGNOSIS — M7989 Other specified soft tissue disorders: Secondary | ICD-10-CM

## 2018-11-05 DIAGNOSIS — B001 Herpesviral vesicular dermatitis: Secondary | ICD-10-CM

## 2018-11-05 DIAGNOSIS — M25649 Stiffness of unspecified hand, not elsewhere classified: Secondary | ICD-10-CM

## 2018-11-05 MED ORDER — VALACYCLOVIR HCL 1 G PO TABS: 2000.0000 mg | ORAL_TABLET | Freq: Two times a day (BID) | ORAL | 5 refills | Status: DC

## 2018-11-05 NOTE — Progress Notes (Signed)
Virtual Visit via Video Note  I connected with@ on 11/05/18 at 11:00 AM EDT by a video enabled telemedicine application and verified that I am speaking with the correct person using two identifiers. Location patient: home Location provider:work office Persons participating in the virtual visit: patient, provider  [place] national recommendations  regarding COVID 19 pandemic   video visit is advised over in office visit for this patient.  Patient aware  of the limitations of evaluation and management by telemedicine and  availability of in person appointments. and agreed to proceed.   HPI: Bridget Elliott presents for video visit She has a history of cold sores usually a few times a year however recently is getting it much more frequently.  Would like to have medication treatment such as Valtrex. In regard to her arthritis her right middle finger is still problematic and swollen however she is beginning to get joint pain and stiffness in her left hand without swelling.  No associated fever her previous labs were reported but noted that the Lyme titer was not back or never resulted. Also there was positive protein in her urine.  No symptoms.  No edema no new change in health status otherwise.  ROS: See pertinent positives and negatives per HPI.  Past Medical History:  Diagnosis Date  . ANEMIA 03/21/2007   Qualifier: History of  By: Danny Lawless CMA, [place]    . Hx of varicella   . 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 Scarlet fever 03/21/2007   Qualifier: History of  By: Danny Lawless CMA, [place]    . UTI (urinary tract infection) 10/22/2011    Past Surgical History:  Procedure Laterality Date  . COLONOSCOPY  1841  . kidney stone removal    . left calf surgery     x2    . TONSILLECTOMY      Family History  Problem Relation Age of Onset  . Cancer Mother   . Hypertension Mother   . Hyperlipidemia Mother   . Cancer Father        Lung,esophageal  . Esophageal cancer Father    . Heart attack Maternal Grandfather   . Heart attack Paternal Grandfather   . Colon cancer Neg Hx   . Colon polyps Neg Hx   . Rectal cancer Neg Hx   . Stomach cancer Neg Hx   . Pancreatic cancer Neg Hx     Social History   Tobacco Use  . Smoking status: Never Smoker  . Smokeless tobacco: Never Used  Substance Use Topics  . Alcohol use: Yes    Alcohol/week: 0.0 standard drinks    Comment: 5 glasses of wine a week  . Drug use: No      Current Outpatient Medications:  .  cholecalciferol (VITAMIN D) 1000 units tablet, Take 2,000 Units by mouth daily., Disp: , Rfl:  .  Elderberry 575 MG/5ML SYRP, Take by mouth., Disp: , Rfl:  .  Multiple Vitamins-Minerals (EMERGEN-C IMMUNE) PACK, Take by mouth daily., Disp: , Rfl:  .  valACYclovir (VALTREX) 1000 MG tablet, Take 2 tablets (2,000 mg total) by mouth 2 (two) times daily. As needed, Disp: 30 tablet, Rfl: 5  EXAM: BP Readings from Last 3 Encounters:  09/23/18 120/64  03/26/18 118/72  11/22/17 130/78    VITALS per patient if applicable:  GENERAL: alert, oriented, appears well and in no acute distress  HEENT: atraumatic, conjunttiva clear, no obvious abnormalities on inspection of external nose and ears  NECK: normal movements of the head and neck  LUNGS: on inspection no signs of respiratory distress, breathing rate appears normal, no obvious gross SOB, gasping or wheezing  CV: no obvious cyanosis   PSYCH/NEURO: pleasant and cooperative, no obvious depression or anxiety, speech and thought processing grossly intact Lab Results  Component Value Date   WBC 5.8 09/23/2018   HGB 15.1 (H) 09/23/2018   HCT 44.5 09/23/2018   PLT 308.0 09/23/2018   GLUCOSE 99 09/23/2018   CHOL 235 (H) 03/26/2018   TRIG 62.0 03/26/2018   HDL 80.70 03/26/2018   LDLDIRECT 146.9 09/20/2009   LDLCALC 142 (H) 03/26/2018   ALT 21 09/23/2018   AST 18 09/23/2018   NA 140 09/23/2018   K 4.3 09/23/2018   CL 103 09/23/2018   CREATININE 0.67  09/23/2018   BUN 16 09/23/2018   CO2 29 09/23/2018   TSH 1.18 03/26/2018   Previous labs blood work from August reviewed urine screen showed positive protein of note this is very common finding without significant proteinuria on our dipsticks. Lyme serology not resulted. 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 Abnormal urine finding  R82.90 Protein / creatinine ratio, urine  3. Pain in joint, multiple sites  M25.50 Protein / creatinine ratio, urine    Ambulatory referral to Rheumatology  4. Morning joint stiffness of hand, unspecified laterality  M25.649 Ambulatory referral to Rheumatology  5. Swelling of right middle finger  M79.89 Ambulatory referral to Rheumatology   With persistent and possibly progressive hand joint symptoms plan rheumatology consult. Will  check and on resulted lab. Although may not be clinically significant plan urine protein creatinine spot urine at the [facility] lab when convenient for her. Prescribed Valtrex discussed use of medicine and refill. Counseled.   Expectant management and discussion of plan and treatment with opportunity to ask questions and all were answered. The patient agreed with the plan and demonstrated an understanding of the instructions.   Advised to call back or seek an in-person evaluation if worsening  or having  further concerns . In interim    Shanon Ace, MD

## 2018-11-24 NOTE — Telephone Encounter (Signed)
So it appears that the Lyme tests was never sent  as I ordered  Because of some issue with ordering  Sets.  ? ( I talked with lab people  )    So apologies .Bridget Elliott    You should not be charged for the test  ( let us know if  You are incorrectly charged)   So would have to  Do another blood draw  to do this testing .  We can arrange this  Or  If rheumatology ordering blood work they could order  If they see appropriate.     Let yus know which way you want to go  On this.

## 2018-11-26 DIAGNOSIS — M25872 Other specified joint disorders, left ankle and foot: Secondary | ICD-10-CM | POA: Diagnosis not present

## 2018-11-26 DIAGNOSIS — M791 Myalgia, unspecified site: Secondary | ICD-10-CM | POA: Diagnosis not present

## 2018-11-26 DIAGNOSIS — M19041 Primary osteoarthritis, right hand: Secondary | ICD-10-CM | POA: Diagnosis not present

## 2018-11-26 DIAGNOSIS — M545 Low back pain: Secondary | ICD-10-CM | POA: Diagnosis not present

## 2018-11-26 DIAGNOSIS — M19042 Primary osteoarthritis, left hand: Secondary | ICD-10-CM | POA: Diagnosis not present

## 2018-11-26 DIAGNOSIS — M25871 Other specified joint disorders, right ankle and foot: Secondary | ICD-10-CM | POA: Diagnosis not present

## 2018-11-26 DIAGNOSIS — M79671 Pain in right foot: Secondary | ICD-10-CM | POA: Diagnosis not present

## 2018-11-26 DIAGNOSIS — D8989 Other specified disorders involving the immune mechanism, not elsewhere classified: Secondary | ICD-10-CM | POA: Diagnosis not present

## 2018-11-26 DIAGNOSIS — M79642 Pain in left hand: Secondary | ICD-10-CM | POA: Diagnosis not present

## 2018-11-26 DIAGNOSIS — M1990 Unspecified osteoarthritis, unspecified site: Secondary | ICD-10-CM | POA: Diagnosis not present

## 2018-11-26 DIAGNOSIS — M7989 Other specified soft tissue disorders: Secondary | ICD-10-CM | POA: Diagnosis not present

## 2018-11-26 DIAGNOSIS — M79672 Pain in left foot: Secondary | ICD-10-CM | POA: Diagnosis not present

## 2018-11-26 DIAGNOSIS — R8290 Unspecified abnormal findings in urine: Secondary | ICD-10-CM | POA: Diagnosis not present

## 2018-11-26 DIAGNOSIS — R768 Other specified abnormal immunological findings in serum: Secondary | ICD-10-CM | POA: Diagnosis not present

## 2018-11-26 DIAGNOSIS — M79643 Pain in unspecified hand: Secondary | ICD-10-CM | POA: Diagnosis not present

## 2018-11-26 DIAGNOSIS — M79641 Pain in right hand: Secondary | ICD-10-CM | POA: Diagnosis not present

## 2018-11-26 DIAGNOSIS — M2560 Stiffness of unspecified joint, not elsewhere classified: Secondary | ICD-10-CM | POA: Diagnosis not present

## 2018-11-26 DIAGNOSIS — M2550 Pain in unspecified joint: Secondary | ICD-10-CM | POA: Diagnosis not present

## 2018-11-26 LAB — POCT ERYTHROCYTE SEDIMENTATION RATE, NON-AUTOMATED: Sed Rate: 1

## 2018-12-04 ENCOUNTER — Other Ambulatory Visit: Payer: Self-pay

## 2018-12-04 ENCOUNTER — Ambulatory Visit
Admission: RE | Admit: 2018-12-04 | Discharge: 2018-12-04 | Disposition: A | Discharge status: Home / Self Care | Payer: BC Managed Care – PPO | Source: Ambulatory Visit | Attending: Obstetrics and Gynecology | Admitting: Obstetrics and Gynecology

## 2018-12-04 DIAGNOSIS — Z1231 Encounter for screening mammogram for malignant neoplasm of breast: Secondary | ICD-10-CM

## 2018-12-04 LAB — POCT ERYTHROCYTE SEDIMENTATION RATE, NON-AUTOMATED: Sed Rate: 1

## 2018-12-10 DIAGNOSIS — N200 Calculus of kidney: Secondary | ICD-10-CM | POA: Diagnosis not present

## 2018-12-10 DIAGNOSIS — M2550 Pain in unspecified joint: Secondary | ICD-10-CM | POA: Diagnosis not present

## 2018-12-10 DIAGNOSIS — R768 Other specified abnormal immunological findings in serum: Secondary | ICD-10-CM | POA: Diagnosis not present

## 2018-12-10 DIAGNOSIS — R3129 Other microscopic hematuria: Secondary | ICD-10-CM | POA: Diagnosis not present

## 2018-12-18 ENCOUNTER — Encounter: Payer: Self-pay | Admitting: Internal Medicine

## 2019-02-04 DIAGNOSIS — L570 Actinic keratosis: Secondary | ICD-10-CM | POA: Diagnosis not present

## 2019-02-04 DIAGNOSIS — L820 Inflamed seborrheic keratosis: Secondary | ICD-10-CM | POA: Diagnosis not present

## 2019-02-11 NOTE — Telephone Encounter (Signed)
Pt states this had already gotten take care of .

## 2019-02-11 NOTE — Telephone Encounter (Signed)
Message Routed to PCP CMA 

## 2019-03-04 NOTE — Telephone Encounter (Signed)
So we can do referral but  Since I have no records the visit may be delayed Suggest make a visit with me to help evaluate  And see if we need to get a ct scan or other testing in interim . [place] see if she wants to do a virtual tomorrow?

## 2019-03-05 ENCOUNTER — Other Ambulatory Visit: Payer: Self-pay

## 2019-03-05 ENCOUNTER — Telehealth (INDEPENDENT_AMBULATORY_CARE_PROVIDER_SITE_OTHER): Payer: BC Managed Care – PPO | Admitting: Internal Medicine

## 2019-03-05 ENCOUNTER — Encounter: Payer: Self-pay | Admitting: Internal Medicine

## 2019-03-05 DIAGNOSIS — R109 Unspecified abdominal pain: Secondary | ICD-10-CM

## 2019-03-05 DIAGNOSIS — Z87442 Personal history of urinary calculi: Secondary | ICD-10-CM | POA: Diagnosis not present

## 2019-03-05 DIAGNOSIS — R1012 Left upper quadrant pain: Secondary | ICD-10-CM | POA: Diagnosis not present

## 2019-03-05 NOTE — Progress Notes (Signed)
Virtual Visit via Video Note  I connected with@ on 03/05/19 at 11:00 AM EST by a video enabled telemedicine application and verified that I am speaking with the correct person using two identifiers. Location patient: home Location provider: home office Persons participating in the virtual visit: patient, provider  [place] national recommendations  regarding COVID 19 pandemic   video visit is advised over in office visit for this patient.  Patient aware  of the limitations of evaluation and management by telemedicine and  availability of in person appointments. and agreed to proceed.   HPI: Bridget Elliott presents for video visit  About referral to urology . Has discomfort off and on reminiscent of renal stone  In past  On left side . No  Gross hematuria .   In the fall had short lived episode  Of pain like stone but no passing.   Had blood in urine when saw rheum   But negative in August . Past hx of renal stone extraction and  lithotripsy  Dr Amalia Hailey 1993  ? Type of stone.   Has done pretty well until recent   ROS: See pertinent positives and negatives per HPI. No fever systemic sx etc .   Past Medical History:  Diagnosis Date  . ANEMIA 03/21/2007   Qualifier: History of  By: Danny Lawless CMA, [place]    . Hx of varicella   . 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 Scarlet fever 03/21/2007   Qualifier: History of  By: Danny Lawless CMA, [place]    . UTI (urinary tract infection) 10/22/2011    Past Surgical History:  Procedure Laterality Date  . COLONOSCOPY  1841  . kidney stone removal    . left calf surgery     x2    . TONSILLECTOMY      Family History  Problem Relation Age of Onset  . Cancer Mother   . Hypertension Mother   . Hyperlipidemia Mother   . Cancer Father        Lung,esophageal  . Esophageal cancer Father   . Heart attack Maternal Grandfather   . Heart attack Paternal Grandfather   . Colon cancer Neg Hx   . Colon polyps Neg Hx   . Rectal cancer Neg Hx    . Stomach cancer Neg Hx   . Pancreatic cancer Neg Hx     Social History   Tobacco Use  . Smoking status: Never Smoker  . Smokeless tobacco: Never Used  Substance Use Topics  . Alcohol use: Yes    Alcohol/week: 0.0 standard drinks    Comment: 5 glasses of wine a week  . Drug use: No      Current Outpatient Medications:  .  cholecalciferol (VITAMIN D) 1000 units tablet, Take 2,000 Units by mouth daily., Disp: , Rfl:  .  Elderberry 575 MG/5ML SYRP, Take by mouth., Disp: , Rfl:  .  Multiple Vitamins-Minerals (EMERGEN-C IMMUNE) PACK, Take by mouth daily., Disp: , Rfl:  .  valACYclovir (VALTREX) 1000 MG tablet, Take 2 tablets (2,000 mg total) by mouth 2 (two) times daily. As needed, Disp: 30 tablet, Rfl: 5  EXAM: BP Readings from Last 3 Encounters:  09/23/18 120/64  03/26/18 118/72  11/22/17 130/78    VITALS per patient if applicable:  GENERAL: alert, oriented, appears well and in no acute distress  HEENT: atraumatic, conjunttiva clear, no obvious abnormalities on inspection of external nose and ears  NECK: normal movements of the head and  neck  LUNGS: on inspection no signs of respiratory distress, breathing rate appears normal, no obvious gross SOB, gasping or wheezing  CV: no obvious cyanosis  MS: moves all visible extremities without noticeable abnormality  PSYCH/NEURO: pleasant and cooperative, no obvious depression or anxiety, speech and thought processing grossly intact Lab Results  Component Value Date   WBC 5.8 09/23/2018   HGB 15.1 (H) 09/23/2018   HCT 44.5 09/23/2018   PLT 308.0 09/23/2018   GLUCOSE 99 09/23/2018   CHOL 235 (H) 03/26/2018   TRIG 62.0 03/26/2018   HDL 80.70 03/26/2018   LDLDIRECT 146.9 09/20/2009   LDLCALC 142 (H) 03/26/2018   ALT 21 09/23/2018   AST 18 09/23/2018   NA 140 09/23/2018   K 4.3 09/23/2018   CL 103 09/23/2018   CREATININE 0.67 09/23/2018   BUN 16 09/23/2018   CO2 29 09/23/2018   TSH 1.18 03/26/2018    ASSESSMENT  AND PLAN:  Discussed the following assessment and plan:    ICD-10-CM   1. Left sided abdominal pain  R10.9 CT RENAL STONE STUDY    Ambulatory referral to Urology   suspected reanl stones  2. Personal history of kidney stones  Z87.442 CT RENAL STONE STUDY    Ambulatory referral to Urology  3. Left upper quadrant abdominal pain  R10.12 CT RENAL STONE STUDY   Plan abd CT to image for stones and referral to urology as planned ( dr Amalia Hailey  No longer in local practice)  Counseled.   Expectant management and discussion of plan and treatment with opportunity to ask questions and all were answered. The patient agreed with the plan and demonstrated an understanding of the instructions.   Advised to call back or seek an in-person evaluation if worsening  or having  further concerns . Return if symptoms worsen  in interim.  Outside external source  DATA REVIEWED:   Rheumatology eval  Lab   Total time on date  of service including record review ordering and plan of care:   32 minutes    Shanon Ace, MD

## 2019-03-10 ENCOUNTER — Ambulatory Visit
Admission: RE | Admit: 2019-03-10 | Discharge: 2019-03-10 | Disposition: A | Discharge status: Home / Self Care | Payer: BC Managed Care – PPO | Source: Ambulatory Visit | Attending: Internal Medicine | Admitting: Internal Medicine

## 2019-03-10 DIAGNOSIS — R1012 Left upper quadrant pain: Secondary | ICD-10-CM

## 2019-03-10 DIAGNOSIS — Z87442 Personal history of urinary calculi: Secondary | ICD-10-CM

## 2019-03-10 DIAGNOSIS — R109 Unspecified abdominal pain: Secondary | ICD-10-CM

## 2019-03-10 DIAGNOSIS — N200 Calculus of kidney: Secondary | ICD-10-CM | POA: Diagnosis not present

## 2019-03-10 DIAGNOSIS — K429 Umbilical hernia without obstruction or gangrene: Secondary | ICD-10-CM | POA: Diagnosis not present

## 2019-03-10 NOTE — Progress Notes (Signed)
Non obstructing stones  not sure if related to your discomfort  other findings seem incidental and not  serious . We have placed a referral to urology  so you should be getting appt   let us know if not  happening

## 2019-03-26 ENCOUNTER — Telehealth: Payer: Self-pay | Admitting: Internal Medicine

## 2019-03-26 DIAGNOSIS — M545 Low back pain: Secondary | ICD-10-CM | POA: Diagnosis not present

## 2019-03-26 DIAGNOSIS — N200 Calculus of kidney: Secondary | ICD-10-CM | POA: Diagnosis not present

## 2019-03-26 NOTE — Telephone Encounter (Signed)
Pt wanted to make sure that her CT scan was sent to [facility] to Dr. Gloriann Loan   Pt can be reached at [phone]- ok to leave detailed VM per pt

## 2019-03-27 NOTE — Telephone Encounter (Signed)
This has been sent over pt has been informed

## 2019-04-04 ENCOUNTER — Encounter: Payer: Self-pay | Admitting: Internal Medicine

## 2019-04-16 DIAGNOSIS — Z6823 Body mass index (BMI) 23.0-23.9, adult: Secondary | ICD-10-CM | POA: Diagnosis not present

## 2019-04-16 DIAGNOSIS — Z13820 Encounter for screening for osteoporosis: Secondary | ICD-10-CM | POA: Diagnosis not present

## 2019-04-16 DIAGNOSIS — Z01419 Encounter for gynecological examination (general) (routine) without abnormal findings: Secondary | ICD-10-CM | POA: Diagnosis not present

## 2019-05-29 DIAGNOSIS — Z20822 Contact with and (suspected) exposure to covid-19: Secondary | ICD-10-CM | POA: Diagnosis not present

## 2019-06-18 ENCOUNTER — Ambulatory Visit (INDEPENDENT_AMBULATORY_CARE_PROVIDER_SITE_OTHER): Payer: BC Managed Care – PPO | Admitting: Family Medicine

## 2019-06-18 ENCOUNTER — Encounter: Payer: Self-pay | Admitting: Family Medicine

## 2019-06-18 ENCOUNTER — Other Ambulatory Visit: Payer: Self-pay

## 2019-06-18 VITALS — BP 128/82 | HR 77 | Temp 97.8°F | Wt 144.0 lb

## 2019-06-18 DIAGNOSIS — S40262A Insect bite (nonvenomous) of left shoulder, initial encounter: Secondary | ICD-10-CM | POA: Diagnosis not present

## 2019-06-18 DIAGNOSIS — W57XXXA Bitten or stung by nonvenomous insect and other nonvenomous arthropods, initial encounter: Secondary | ICD-10-CM

## 2019-06-18 DIAGNOSIS — M2560 Stiffness of unspecified joint, not elsewhere classified: Secondary | ICD-10-CM | POA: Diagnosis not present

## 2019-06-18 DIAGNOSIS — A6920 Lyme disease, unspecified: Secondary | ICD-10-CM | POA: Diagnosis not present

## 2019-06-18 LAB — CBC WITH DIFFERENTIAL/PLATELET
Basophils Absolute: 0 10*3/uL (ref 0.0–0.1)
Basophils Relative: 0.4 % (ref 0.0–3.0)
Eosinophils Absolute: 0.1 10*3/uL (ref 0.0–0.7)
Eosinophils Relative: 1.4 % (ref 0.0–5.0)
HCT: 41.9 % (ref 36.0–46.0)
Hemoglobin: 14.3 g/dL (ref 12.0–15.0)
Lymphocytes Relative: 29.5 % (ref 12.0–46.0)
Lymphs Abs: 2.2 10*3/uL (ref 0.7–4.0)
MCHC: 34.1 g/dL (ref 30.0–36.0)
MCV: 93.1 fl (ref 78.0–100.0)
Monocytes Absolute: 0.8 10*3/uL (ref 0.1–1.0)
Monocytes Relative: 10.3 % (ref 3.0–12.0)
Neutro Abs: 4.3 10*3/uL (ref 1.4–7.7)
Neutrophils Relative %: 58.4 % (ref 43.0–77.0)
Platelets: 294 10*3/uL (ref 150.0–400.0)
RBC: 4.5 Mil/uL (ref 3.87–5.11)
RDW: 13.3 % (ref 11.5–15.5)
WBC: 7.4 10*3/uL (ref 4.0–10.5)

## 2019-06-18 MED ORDER — DOXYCYCLINE HYCLATE 100 MG PO TABS: 100.0000 mg | ORAL_TABLET | Freq: Two times a day (BID) | ORAL | 0 refills | Status: AC

## 2019-06-18 NOTE — Patient Instructions (Signed)
Tick Bite Information, Adult  Ticks are insects that draw blood for food. Most ticks live in shrubs and grassy areas. They climb onto people and animals that brush against the leaves and grasses that they rest on. Then they bite, attaching themselves to the skin. Most ticks are harmless, but some ticks carry germs that can spread to a person through a bite and cause a disease. To reduce your risk of getting a disease from a tick bite, it is important to take steps to prevent tick bites. It is also important to check for ticks after being outdoors. If you find that a tick has attached to you, watch for symptoms of disease. How can I prevent tick bites? Take these steps to help prevent tick bites when you are outdoors in an area where ticks are found:  Use insect repellent that has DEET (20% or higher), picaridin, or IR3535 in it. Use it on: ? Skin that is showing. ? The top of your boots. ? Your pant legs. ? Your sleeve cuffs.  For repellent products that contain permethrin, follow product instructions. Use these products on: ? Clothing. ? Gear. ? Boots. ? Tents.  Wear protective clothing. Long sleeves and long pants offer the best protection from ticks.  Wear light-colored clothing so you can see ticks more easily.  Tuck your pant legs into your socks.  If you go walking on a trail, stay in the middle of the trail so your skin, hair, and clothing do not touch the bushes.  Avoid walking through areas with long grass.  Check for ticks on your clothing, hair, and skin often while you are outside, and check again before you go inside. Make sure to check the places that ticks attach themselves most often. These places include the scalp, neck, armpits, waist, groin, and joint areas. Ticks that carry a disease called Lyme disease have to be attached to the skin for 24-48 hours. Checking for ticks every day will lessen your risk of this and other diseases.  When you come indoors, wash your  clothes and take a shower or a bath right away. Dry your clothes in a dryer on high heat for at least 60 minutes. This will kill any ticks in your clothes. What is the proper way to remove a tick? If you find a tick on your body, remove it as soon as possible. Removing a tick sooner rather than later can prevent germs from passing from the tick to your body. To remove a tick that is crawling on your skin but has not bitten:  Go outdoors and brush the tick off.  Remove the tick with tape or a lint roller. To remove a tick that is attached to your skin:  Wash your hands.  If you have latex gloves, put them on.  Use tweezers, curved forceps, or a tick-removal tool to gently grasp the tick as close to your skin and the tick's head as possible.  Gently pull with steady, upward pressure until the tick lets go. When removing the tick: ? Take care to keep the tick's head attached to its body. ? Do not twist or jerk the tick. This can make the tick's head or mouth break off. ? Do not squeeze or crush the tick's body. This could force disease-carrying fluids from the tick into your body. Do not try to remove a tick with heat, alcohol, petroleum jelly, or fingernail polish. Using these methods can cause the tick to salivate and regurgitate into   your bloodstream, increasing your risk of getting a disease. What should I do after removing a tick?  Clean the bite area with soap and water, rubbing alcohol, or an iodine scrub.  If an antiseptic cream or ointment is available, apply a small amount to the bite site.  Wash and disinfect any instruments that you used to remove the tick. How should I dispose of a tick? To dispose of a live tick, use one of these methods:  Place it in rubbing alcohol.  Place it in a sealed bag or container.  Wrap it tightly in tape.  Flush it down the toilet. Contact a health care provider if:  You have symptoms of a disease after a tick bite. Symptoms of a  tick-borne disease can occur from moments after the tick bites to up to 30 days after a tick is removed. Symptoms include: ? Muscle, joint, or bone pain. ? Difficulty walking or moving your legs. ? Numbness in the legs. ? Paralysis. ? Red rash around the tick bite area that is shaped like a target or a "bull's-eye." ? Redness and swelling in the area of the tick bite. ? Fever. ? Repeated vomiting. ? Diarrhea. ? Weight loss. ? Tender, swollen lymph glands. ? Shortness of breath. ? Cough. ? Pain in the abdomen. ? Headache. ? Abnormal tiredness. ? A change in your level of consciousness. ? Confusion. Get help right away if:  You are not able to remove a tick.  A part of a tick breaks off and gets stuck in your skin.  Your symptoms get worse. Summary  Ticks may carry germs that can spread to a person through a bite and cause disease.  Wear protective clothing and use insect repellent to prevent tick bites. Follow product instructions.  If you find a tick on your body, remove it as soon as possible. If the tick is attached, do not try to remove with heat, alcohol, petroleum jelly, or fingernail polish.  Remove the attached tick using tweezers, curved forceps, or a tick-removal tool. Gently pull with steady, upward pressure until the tick lets go. Do not twist or jerk the tick. Do not squeeze or crush the tick's body.  If you have symptoms after being bitten by a tick, contact a health care provider. This information is not intended to replace advice given to you by your health care provider. Make sure you discuss any questions you have with your health care provider. Document Revised: 12/28/2016 Document Reviewed: 10/28/2015 Elsevier Patient Education  2020 Else[employer] Inc.  Lyme Disease Lyme disease is an infection that can affect many parts of the body, including the skin, joints, and nervous system. It is a bacterial infection that starts from the bite of an infected tick. Over  time, the infection can worsen, and some of the symptoms are similar to the flu. If Lyme disease is not treated, it may cause joint pain, swelling, numbness, problems thinking, fatigue, muscle weakness, and other problems. What are the causes? This condition is caused by bacteria called Borrelia burgdorferi.  You can get Lyme disease by being bitten by an infected tick.  Only black-legged, or Ixodes, ticks that are infected with the bacteria can cause Lyme disease.  The tick must be attached to your skin for a certain period of time to pass along the infection. This is usually 36-48 hours.  Deer often carry infected ticks. What increases the risk? The following factors may make you more likely to develop this condition:    Living in or visiting these areas in the [place] ? [place]. ? [place]. ? [place].  Spending time in wooded or grassy areas.  Being outdoors with exposed skin.  Camping, gardening, hiking, fishing, hunting, or working outdoors.  Failing to remove a tick from your skin. What are the signs or symptoms? Symptoms of this condition may include:  Chills and fever.  Headache.  Fatigue.  General achiness.  Muscle pain.  Joint pain, often in the knees.  A round, red rash that surrounds the center of the tick bite. The center of the rash may be blood colored or have tiny blisters.  Swollen lymph glands.  Stiff neck. How is this diagnosed? This condition is diagnosed based on:  Your symptoms and medical history.  A physical exam.  A blood test. How is this treated? The main treatment for this condition is antibiotic medicine, which is usually taken by mouth (orally).  The length of treatment depends on how soon after a tick bite you begin taking the medicine. In some cases, treatment is necessary for several weeks.  If the infection is severe, antibiotics may need to be given through an IV that is inserted into one of your  veins. Follow these instructions at home:  Take over-the-counter and prescription medicines only as told by your health care provider. Finish all antibiotic medicine, even when you start to feel better.  Ask your health care provider about taking a probiotic in between doses of your antibiotic to help avoid an upset stomach or diarrhea.  Check with your health care provider before supplementing your treatment. Many alternative therapies have not been proven and may be harmful to you.  Keep all follow-up visits as told by your health care provider. This is important. How is this prevented? You can become reinfected if you get another tick bite from an infected tick. Take these steps to help prevent an infection:  Cover your skin with light-colored clothing when you are outdoors in the spring and summer months.  Spray clothing and skin with bug spray. The spray should be 20-30% DEET. You can also treat clothing with permethrin, and let it dry before you wear it. Do not apply permethrin directly to your skin. Permethrin can also be used to treat camping gear and boots. Always read and follow the instructions that come with a bug spray or insecticide.  Avoid wooded, grassy, and shaded areas.  Remove yard litter, brush, trash, and plants that attract deer and rodents.  Check yourself for ticks when you come indoors.  Wash clothing worn each day.  Shower after spending time outdoors.  Check your pets for ticks before they come inside.  If you find a tick attached to your skin: ? Remove it with tweezers. ? Clean your hands and the bite area with rubbing alcohol or soap and water. ? Dispose of the tick by putting it in rubbing alcohol, putting it in a sealed bag or container, or flushing it down the toilet. ? You may choose to save the tick in a sealed container if you wish for it to be tested at a later time. Pregnant women should take special care to avoid tick bites because it is  possible that the infection may be passed along to the fetus. Contact a health care provider if:  You have symptoms after treatment.  You have removed a tick and want to bring it to your health care provider for testing. Get help right away   if:  You have an irregular heartbeat.  You have chest pain.  You have nerve pain.  Your face feels numb.  You develop the following: ? A stiff neck. ? A severe headache. ? Severe nausea and vomiting. ? Sensitivity to light. Summary  Lyme disease is an infection that can affect many parts of the body, including the skin, joints, and nervous system.  This condition is caused by bacteria called Borrelia burgdorferi.  You can get Lyme disease by being bitten by an infected tick.  The main treatment for this condition is antibiotic medicine. This information is not intended to replace advice given to you by your health care provider. Make sure you discuss any questions you have with your health care provider. Document Revised: 05/09/2018 Document Reviewed: 04/03/2018 Elsevier Patient Education  2020 Elsevier Inc.  

## 2019-06-18 NOTE — Progress Notes (Signed)
Subjective:    Patient ID: Bridget Elliott, female    DOB: Jul 20, 1955, 64 y.o.   MRN: [id number]  No chief complaint on file.   HPI Patient was seen today for acute concern.  Pt notes tick bite on L shoulder 2 wks ago after working in the garden.  Pt removed the tick.  Over the last few days shoulder became painful.  Pt noted a ringed rash.  Pt endorses recent fatigue and joint pain.  Patient notes ankle pain resting the morning when waking up.  In the past patient states she is seen by rheumatology for pain in joints of fingers.  At the time was advised labs for Lyme were negative.  Past Medical History:  Diagnosis Date  . ANEMIA 03/21/2007   Qualifier: History of  By: Danny Lawless CMA, [place]    . Hx of varicella   . 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 Scarlet fever 03/21/2007   Qualifier: History of  By: Danny Lawless CMA, [place]    . UTI (urinary tract infection) 10/22/2011    No Known Allergies  ROS General: Denies fever, chills, night sweats, changes in weight, changes in appetite HEENT: Denies headaches, ear pain, changes in vision, rhinorrhea, sore throat CV: Denies CP, palpitations, SOB, orthopnea Pulm: Denies SOB, cough, wheezing GI: Denies abdominal pain, nausea, vomiting, diarrhea, constipation GU: Denies dysuria, hematuria, frequency, vaginal discharge Msk: Denies muscle cramps  +joint pains Neuro: Denies weakness, numbness, tingling Skin: Denies bruising  + rash on posterior left shoulder Psych: Denies depression, anxiety, hallucination     Objective:    Blood pressure 128/82, pulse 77, temperature 97.8 F (36.6 C), temperature source Temporal, weight 144 lb (65.3 kg), SpO2 96 %.  Gen. Pleasant, well-nourished, in no distress, normal affect   HEENT: [place]/AT, face symmetric, conjunctiva clear, no scleral icterus, PERRLA, EOMI, nares patent without drainage Lungs: no accessory muscle use Cardiovascular: RRR, no peripheral edema Musculoskeletal: No deformities,  no cyanosis or clubbing, normal tone Neuro:  A&Ox3, CN II-XII intact, normal gait Skin:  Warm, dry, intact.  Posterior left shoulder with a target lesion with central area of eschar at site tick was removed.      Wt Readings from Last 3 Encounters:  09/23/18 142 lb 3.2 oz (64.5 kg)  03/26/18 142 lb 9.6 oz (64.7 kg)  11/22/17 141 lb (64 kg)    Lab Results  Component Value Date   WBC 5.8 09/23/2018   HGB 15.1 (H) 09/23/2018   HCT 44.5 09/23/2018   PLT 308.0 09/23/2018   GLUCOSE 99 09/23/2018   CHOL 235 (H) 03/26/2018   TRIG 62.0 03/26/2018   HDL 80.70 03/26/2018   LDLDIRECT 146.9 09/20/2009   LDLCALC 142 (H) 03/26/2018   ALT 21 09/23/2018   AST 18 09/23/2018   NA 140 09/23/2018   K 4.3 09/23/2018   CL 103 09/23/2018   CREATININE 0.67 09/23/2018   BUN 16 09/23/2018   CO2 29 09/23/2018   TSH 1.18 03/26/2018    Assessment/Plan:  Erythema migrans (Lyme disease)  - discussed potential infections a/w tick bites -discussed r/b/a of treatment with doxycycline -will start doxycycline x 14 days -Given handout -Given precautions - Plan: doxycycline (VIBRA-TABS) 100 MG tablet, B. burgdorfi Antibody, CBC with Differential/Platelet  Tick bite, initial encounter -We will treat with doxycycline -Discussed prevention - Plan: B. burgdorfi Antibody, CBC with Differential/Platelet  Morning joint stiffness -Given ongoing issues discussed follow-up with rheumatology -Consider repeating autoimmune labs  F/u prn  Estephany Perot, MD 

## 2019-06-19 LAB — B. BURGDORFI ANTIBODIES: B burgdorferi Ab IgG+IgM: <0.9 index

## 2020-02-12 ENCOUNTER — Other Ambulatory Visit: Payer: Self-pay | Admitting: Obstetrics and Gynecology

## 2020-02-12 DIAGNOSIS — Z1231 Encounter for screening mammogram for malignant neoplasm of breast: Secondary | ICD-10-CM

## 2020-03-22 ENCOUNTER — Ambulatory Visit: Payer: BC Managed Care – PPO | Admitting: Internal Medicine

## 2020-03-22 ENCOUNTER — Encounter: Payer: Self-pay | Admitting: Internal Medicine

## 2020-03-22 ENCOUNTER — Other Ambulatory Visit: Payer: Self-pay

## 2020-03-22 VITALS — BP 124/82 | HR 88 | Temp 98.6°F | Wt 144.8 lb

## 2020-03-22 DIAGNOSIS — R3989 Other symptoms and signs involving the genitourinary system: Secondary | ICD-10-CM

## 2020-03-22 DIAGNOSIS — R300 Dysuria: Secondary | ICD-10-CM | POA: Diagnosis not present

## 2020-03-22 LAB — POCT URINALYSIS DIPSTICK
Bilirubin, UA: NEGATIVE
Glucose, UA: NEGATIVE
Ketones, UA: NEGATIVE
Nitrite, UA: NEGATIVE
Protein, UA: POSITIVE — AB
Spec Grav, UA: 1.025 (ref 1.010–1.025)
Urobilinogen, UA: 0.2 E.U./dL
pH, UA: 6 (ref 5.0–8.0)

## 2020-03-22 MED ORDER — NITROFURANTOIN MONOHYD MACRO 100 MG PO CAPS
100.0000 mg | ORAL_CAPSULE | Freq: Two times a day (BID) | ORAL | 0 refills | Status: AC
Start: 2020-03-22 — End: 2020-03-29

## 2020-03-22 NOTE — Patient Instructions (Addendum)
Take antibiotic    And  Will let you know   Culture when back.

## 2020-03-22 NOTE — Progress Notes (Signed)
Chief Complaint  Patient presents with  . Dysuria    HPI: Bridget Elliott 65 y.o. come in for sda onset about 4 days ago of urinary frequency urgency and dysuria without fever nausea vomiting flank pain. Remote history of UTI nothing recently.  Prednisone  in past sees rheumatologist.  Treated last year for presumed Lyme disease with an EM lesion possible.  No residual. ROS: See pertinent positives and negatives per HPI.  Past Medical History:  Diagnosis Date  . ANEMIA 03/21/2007   Qualifier: History of  By: Danny Lawless CMA, [place]    . Hx of varicella   . 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 Scarlet fever 03/21/2007   Qualifier: History of  By: Danny Lawless CMA, [place]    . UTI (urinary tract infection) 10/22/2011    Family History  Problem Relation Age of Onset  . Cancer Mother   . Hypertension Mother   . Hyperlipidemia Mother   . Cancer Father        Lung,esophageal  . Esophageal cancer Father   . Heart attack Maternal Grandfather   . Heart attack Paternal Grandfather   . Colon cancer Neg Hx   . Colon polyps Neg Hx   . Rectal cancer Neg Hx   . Stomach cancer Neg Hx   . Pancreatic cancer Neg Hx     Social History   Socioeconomic History  . Marital status: Married    Spouse name: Not on file  . Number of children: Not on file  . Years of education: Not on file  . Highest education level: Not on file  Occupational History  . Not on file  Tobacco Use  . Smoking status: Never Smoker  . Smokeless tobacco: Never Used  Vaping Use  . Vaping Use: Never used  Substance and Sexual Activity  . Alcohol use: Yes    Alcohol/week: 0.0 standard drinks    Comment: 5 glasses of wine a week  . Drug use: No  . Sexual activity: Yes    Partners: Male  Other Topics Concern  . Not on file  Social History Narrative   7 hours of sleep per night   Does not work outside the home   Lives alone with her husband   2 Cats in the home   G4P4   LIFESTYLE:    Exercise:      Tobacco/ETS:no   Alcohol: per day  0 - 5 per week    Sugar beverages:   Sleep: 7 hours    Drug use: no   Social Determinants of Radio broadcast assistant Strain: Not on file  Food Insecurity: Not on file  Transportation Needs: Not on file  Physical Activity: Not on file  Stress: Not on file  Social Connections: Not on file    Outpatient Medications Prior to Visit  Medication Sig Dispense Refill  . cholecalciferol (VITAMIN D) 1000 units tablet Take 2,000 Units by mouth daily.    Kendall Flack 575 MG/5ML SYRP Take by mouth.    . Multiple Vitamins-Minerals (EMERGEN-C IMMUNE) PACK Take by mouth daily.    . valACYclovir (VALTREX) 1000 MG tablet Take 2 tablets (2,000 mg total) by mouth 2 (two) times daily. As needed 30 tablet 5   No facility-administered medications prior to visit.     EXAM:  BP 124/82 (BP Location: Left Arm, Patient Position: Sitting, Cuff Size: Normal)   Pulse 88   Temp 98.6 F (37 C) (Oral)  Wt 144 lb 12.8 oz (65.7 kg)   SpO2 98%   BMI 25.65 kg/m   Body mass index is 25.65 kg/m.  GENERAL: vitals reviewed and listed above, alert, oriented, appears well hydrated and in no acute distress HEENT: atraumatic, conjunctiva  clear, no obvious abnormalities on inspection of external nose and ears  NECK: no obvious masses on inspection palpation  Abdomen:  Sof,t normal bowel sounds without hepatosplenomegaly, no guarding rebound or masses no CVA tenderness MS: moves all extremities without noticeable focal  abnormality PSYCH: pleasant and cooperative, no obvious depression or anxiety Lab Results  Component Value Date   WBC 7.4 06/18/2019   HGB 14.3 06/18/2019   HCT 41.9 06/18/2019   PLT 294.0 06/18/2019   GLUCOSE 99 09/23/2018   CHOL 235 (H) 03/26/2018   TRIG 62.0 03/26/2018   HDL 80.70 03/26/2018   LDLDIRECT 146.9 09/20/2009   LDLCALC 142 (H) 03/26/2018   ALT 21 09/23/2018   AST 18 09/23/2018   NA 140 09/23/2018   K 4.3 09/23/2018   CL 103  09/23/2018   CREATININE 0.67 09/23/2018   BUN 16 09/23/2018   CO2 29 09/23/2018   TSH 1.18 03/26/2018   BP Readings from Last 3 Encounters:  03/22/20 124/82  06/18/19 128/82  09/23/18 120/64   Urinalysis    Component Value Date/Time   COLORURINE YELLOW 11/05/2006 1026   APPEARANCEUR Clear 11/05/2006 1026   LABSPEC 1.015 11/05/2006 1026   PHURINE 6.5 11/05/2006 1026   GLUCOSEU NEGATIVE 11/05/2006 1026   BILIRUBINUR neg 03/22/2020 1600   KETONESUR NEGATIVE 11/05/2006 1026   PROTEINUR Positive (A) 03/22/2020 1600   UROBILINOGEN 0.2 03/22/2020 1600   UROBILINOGEN 0.2 mg/dL 11/05/2006 1026   NITRITE neg 03/22/2020 1600   NITRITE Negative 11/05/2006 1026   LEUKOCYTESUR Small (1+) (A) 03/22/2020 1600  2+ blood  Also    ASSESSMENT AND PLAN:  Discussed the following assessment and plan:  Dysuria - Plan: POCT urinalysis dipstick, Urine Culture, Urine Culture  Suspected UTI Most likely cystitis acute uncomplicated Macrobid for 1 week Urine culture pending follow-up changes to pending on results. n                                                                                                                                      -Patient advised to return or notify health care team  if  new concerns arise.  Patient Instructions  Take antibiotic    And  Will let you know   Culture when back.    Standley Brooking. Tucker Steedley M.D.

## 2020-03-24 LAB — URINE CULTURE
MICRO NUMBER:: 11564235
SPECIMEN QUALITY:: ADEQUATE

## 2020-03-24 NOTE — Progress Notes (Signed)
Tell patient that urine culture shows e coli  sensitive to medication given . Should resolve with current treatment .FU if not better.

## 2020-03-28 ENCOUNTER — Ambulatory Visit
Admission: RE | Admit: 2020-03-28 | Discharge: 2020-03-28 | Disposition: A | Discharge status: Home / Self Care | Payer: BC Managed Care – PPO | Source: Ambulatory Visit | Attending: Obstetrics and Gynecology | Admitting: Obstetrics and Gynecology

## 2020-03-28 ENCOUNTER — Other Ambulatory Visit: Payer: Self-pay

## 2020-03-28 DIAGNOSIS — Z1231 Encounter for screening mammogram for malignant neoplasm of breast: Secondary | ICD-10-CM

## 2020-04-13 ENCOUNTER — Other Ambulatory Visit: Payer: Self-pay

## 2020-04-13 ENCOUNTER — Ambulatory Visit: Payer: BC Managed Care – PPO | Admitting: Internal Medicine

## 2020-04-13 ENCOUNTER — Encounter: Payer: Self-pay | Admitting: Internal Medicine

## 2020-04-13 VITALS — BP 136/70 | HR 87 | Temp 97.4°F | Ht 63.0 in | Wt 142.0 lb

## 2020-04-13 DIAGNOSIS — H66011 Acute suppurative otitis media with spontaneous rupture of ear drum, right ear: Secondary | ICD-10-CM | POA: Diagnosis not present

## 2020-04-13 DIAGNOSIS — H9121 Sudden idiopathic hearing loss, right ear: Secondary | ICD-10-CM

## 2020-04-13 MED ORDER — AMOXICILLIN-POT CLAVULANATE 875-125 MG PO TABS
1.0000 | ORAL_TABLET | Freq: Two times a day (BID) | ORAL | 0 refills | Status: DC
Start: 2020-04-13 — End: 2021-03-28

## 2020-04-13 NOTE — Patient Instructions (Signed)
Antibiotic   And we will refer to  ENT   At this time nothing in ear .   Marland Kitchen

## 2020-04-13 NOTE — Progress Notes (Signed)
Chief Complaint  Patient presents with  . Ear Pain    Patient complains of pain in her right ear, patient said she noticed head congestion x1 week followed by intense ear pain. Patient stated that her ear started bleeding x4 days ago. Patient tried advil and decongestion medication with initial relief.    HPI: Bridget Elliott 65 y.o. come in for  Above  Onset with ur congestion  End of last week was in [place] visiting family, and then 4 days  ago had sudden increase of pain right ear bad enough as " colic in ear" used decongestant and heating pad .  Then had noises and then blood in ear  And couldn't hear after t hat . Pain now less as well as sinus pain . Drove back home  . No fever    Was around [facility] but no acute illnesss  Put nothing in ear  ROS: See pertinent positives and negatives per HPI. Never had fever or  Systemic sx   Past Medical History:  Diagnosis Date  . ANEMIA 03/21/2007   Qualifier: History of  By: Danny Lawless CMA, [place]    . Hx of varicella   . 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 Scarlet fever 03/21/2007   Qualifier: History of  By: Danny Lawless CMA, [place]    . UTI (urinary tract infection) 10/22/2011    Family History  Problem Relation Age of Onset  . Cancer Mother   . Hypertension Mother   . Hyperlipidemia Mother   . Cancer Father        Lung,esophageal  . Esophageal cancer Father   . Heart attack Maternal Grandfather   . Heart attack Paternal Grandfather   . Colon cancer Neg Hx   . Colon polyps Neg Hx   . Rectal cancer Neg Hx   . Stomach cancer Neg Hx   . Pancreatic cancer Neg Hx     Social History   Socioeconomic History  . Marital status: Married    Spouse name: Not on file  . Number of children: Not on file  . Years of education: Not on file  . Highest education level: Not on file  Occupational History  . Not on file  Tobacco Use  . Smoking status: Never Smoker  . Smokeless tobacco: Never Used  Vaping Use  . Vaping Use: Never used   Substance and Sexual Activity  . Alcohol use: Yes    Alcohol/week: 0.0 standard drinks    Comment: 5 glasses of wine a week  . Drug use: No  . Sexual activity: Yes    Partners: Male  Other Topics Concern  . Not on file  Social History Narrative   7 hours of sleep per night   Does not work outside the home   Lives alone with her husband   2 Cats in the home   G4P4   LIFESTYLE:    Exercise:     Tobacco/ETS:no   Alcohol: per day  0 - 5 per week    Sugar beverages:   Sleep: 7 hours    Drug use: no   Social Determinants of Radio broadcast assistant Strain: Not on file  Food Insecurity: Not on file  Transportation Needs: Not on file  Physical Activity: Not on file  Stress: Not on file  Social Connections: Not on file    Outpatient Medications Prior to Visit  Medication Sig Dispense Refill  . cholecalciferol (VITAMIN D)  1000 units tablet Take 2,000 Units by mouth daily.    Kendall Flack 575 MG/5ML SYRP Take by mouth.    . Multiple Vitamins-Minerals (EMERGEN-C IMMUNE) PACK Take by mouth daily.    . valACYclovir (VALTREX) 1000 MG tablet Take 2 tablets (2,000 mg total) by mouth 2 (two) times daily. As needed 30 tablet 5   No facility-administered medications prior to visit.     EXAM:  BP 136/70 (BP Location: Left Arm, Patient Position: Sitting, Cuff Size: Normal)   Pulse 87   Temp (!) 97.4 F (36.3 C) (Oral)   Ht 5\' 3"  (1.6 m)   Wt 142 lb (64.4 kg)   SpO2 96%   BMI 25.15 kg/m   Body mass index is 25.15 kg/m.  GENERAL: vitals reviewed and listed above, alert, oriented, appears well hydrated and in no acute distress HEENT: atraumatic, conjunctiva  clear, no obvious abnormalities on inspection of external nose and ears  Left eac 2+ wax tm grey    Right eac  Dried blood  Lateral and inferior and distorted tm    No pus in canal  OP : masked  Mild nasal congestion face non tender  NECK: no obvious masses on inspection palpation    MS: moves all extremities without  noticeable focal  abnormality PSYCH: pleasant and cooperative, no obvious depression or anxiety  BP Readings from Last 3 Encounters:  04/13/20 136/70  03/22/20 124/82  06/18/19 128/82    ASSESSMENT AND PLAN:  Discussed the following assessment and plan:  Non-recurrent acute suppurative otitis media of right ear with spontaneous rupture of tympanic membrane - Plan: Ambulatory referral to ENT  Sudden right hearing loss - Plan: Ambulatory referral to ENT Suspected AOM  with rupture  And secondary hearing loss .   Antibiotic and referral to ent   -Patient advised to return or notify health care team  if  new concerns arise.  Patient Instructions  Antibiotic   And we will refer to  ENT   At this time nothing in ear .   Marland Kitchen     Standley Brooking. Hyder Deman M.D.

## 2020-04-14 DIAGNOSIS — H9221 Otorrhagia, right ear: Secondary | ICD-10-CM | POA: Diagnosis not present

## 2020-04-18 DIAGNOSIS — Z6823 Body mass index (BMI) 23.0-23.9, adult: Secondary | ICD-10-CM | POA: Diagnosis not present

## 2020-04-18 DIAGNOSIS — Z01419 Encounter for gynecological examination (general) (routine) without abnormal findings: Secondary | ICD-10-CM | POA: Diagnosis not present

## 2020-05-05 DIAGNOSIS — H9221 Otorrhagia, right ear: Secondary | ICD-10-CM | POA: Diagnosis not present

## 2020-05-27 DIAGNOSIS — D0361 Melanoma in situ of right upper limb, including shoulder: Secondary | ICD-10-CM | POA: Diagnosis not present

## 2020-05-27 DIAGNOSIS — D485 Neoplasm of uncertain behavior of skin: Secondary | ICD-10-CM | POA: Diagnosis not present

## 2020-05-27 DIAGNOSIS — L570 Actinic keratosis: Secondary | ICD-10-CM | POA: Diagnosis not present

## 2020-06-20 DIAGNOSIS — S92515A Nondisplaced fracture of proximal phalanx of left lesser toe(s), initial encounter for closed fracture: Secondary | ICD-10-CM | POA: Diagnosis not present

## 2020-07-12 DIAGNOSIS — S92515D Nondisplaced fracture of proximal phalanx of left lesser toe(s), subsequent encounter for fracture with routine healing: Secondary | ICD-10-CM | POA: Diagnosis not present

## 2020-08-23 DIAGNOSIS — L989 Disorder of the skin and subcutaneous tissue, unspecified: Secondary | ICD-10-CM | POA: Diagnosis not present

## 2020-08-23 DIAGNOSIS — D0361 Melanoma in situ of right upper limb, including shoulder: Secondary | ICD-10-CM | POA: Diagnosis not present

## 2020-08-23 DIAGNOSIS — L905 Scar conditions and fibrosis of skin: Secondary | ICD-10-CM | POA: Diagnosis not present

## 2020-08-29 IMAGING — MG DIGITAL SCREENING BILAT W/ TOMO W/ CAD
6 of 10 series · 6 of 30 positions shown · non-contrast
Comparison: Previous exam(s).

CLINICAL DATA: Screening.

EXAM:
DIGITAL SCREENING BILATERAL MAMMOGRAM WITH TOMO AND CAD

[L CC synth-2D]
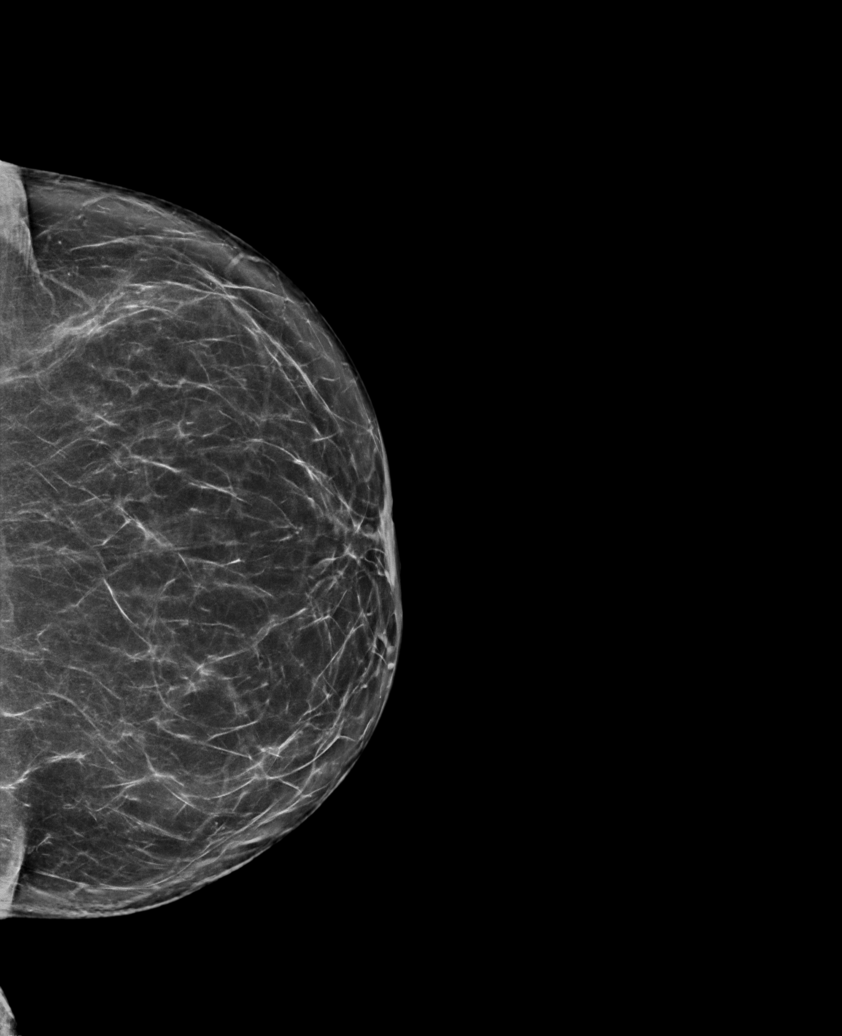

[R MLO synth-2D (1 of 2)]
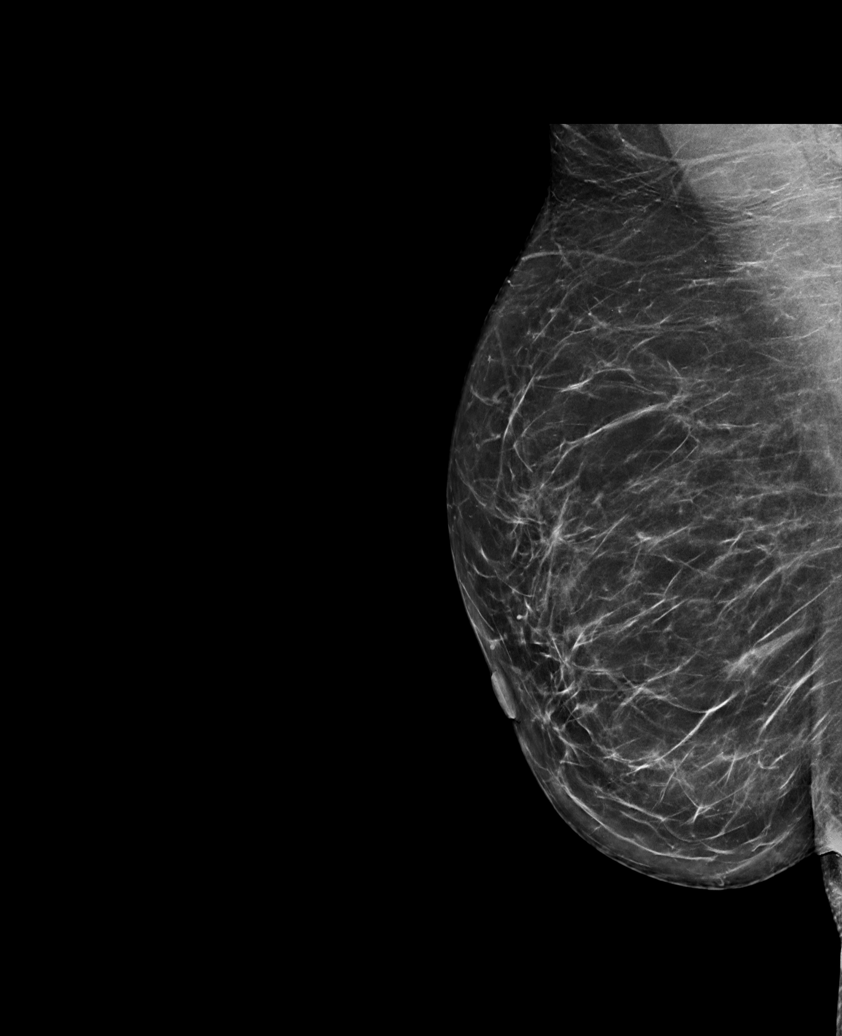

[R CC synth-2D]
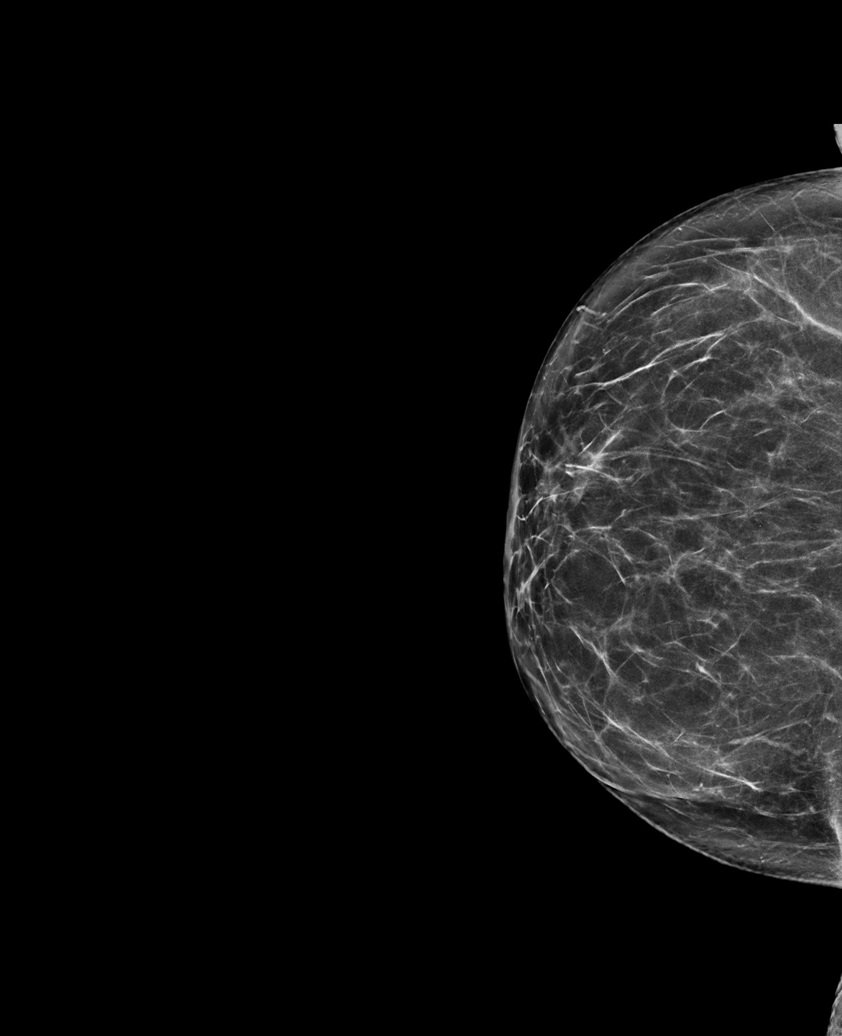

[L MLO synth-2D]
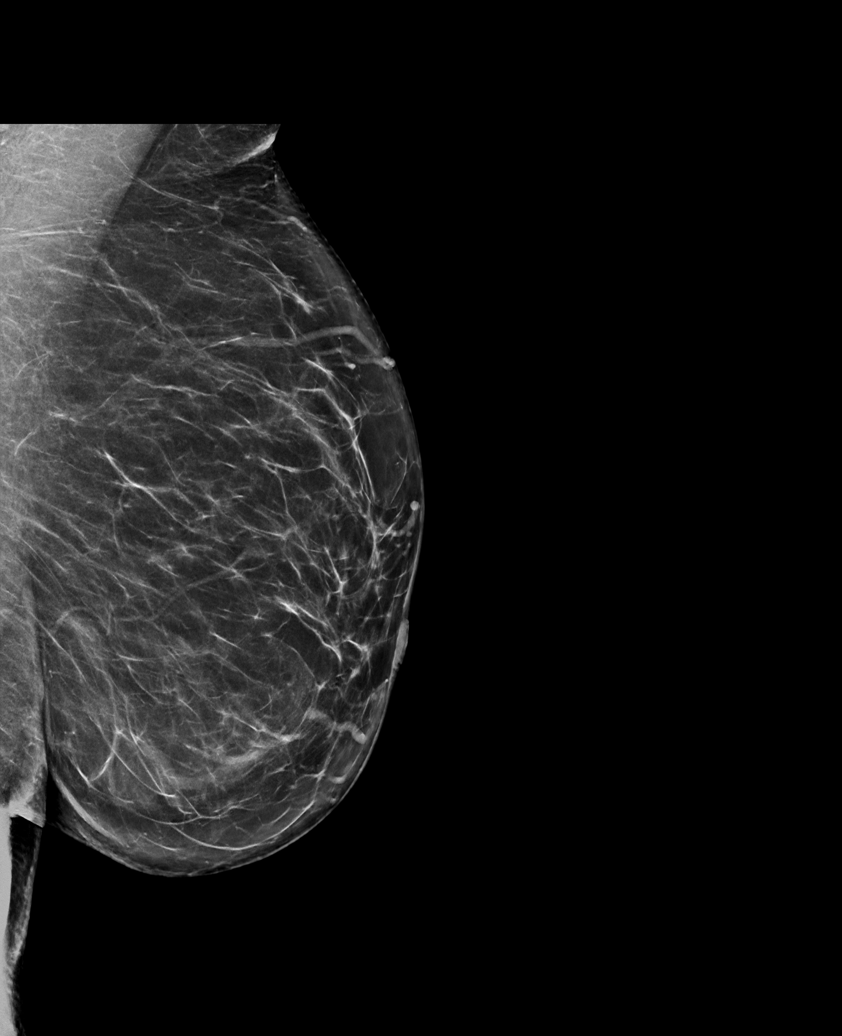

[R MLO synth-2D (2 of 2)]
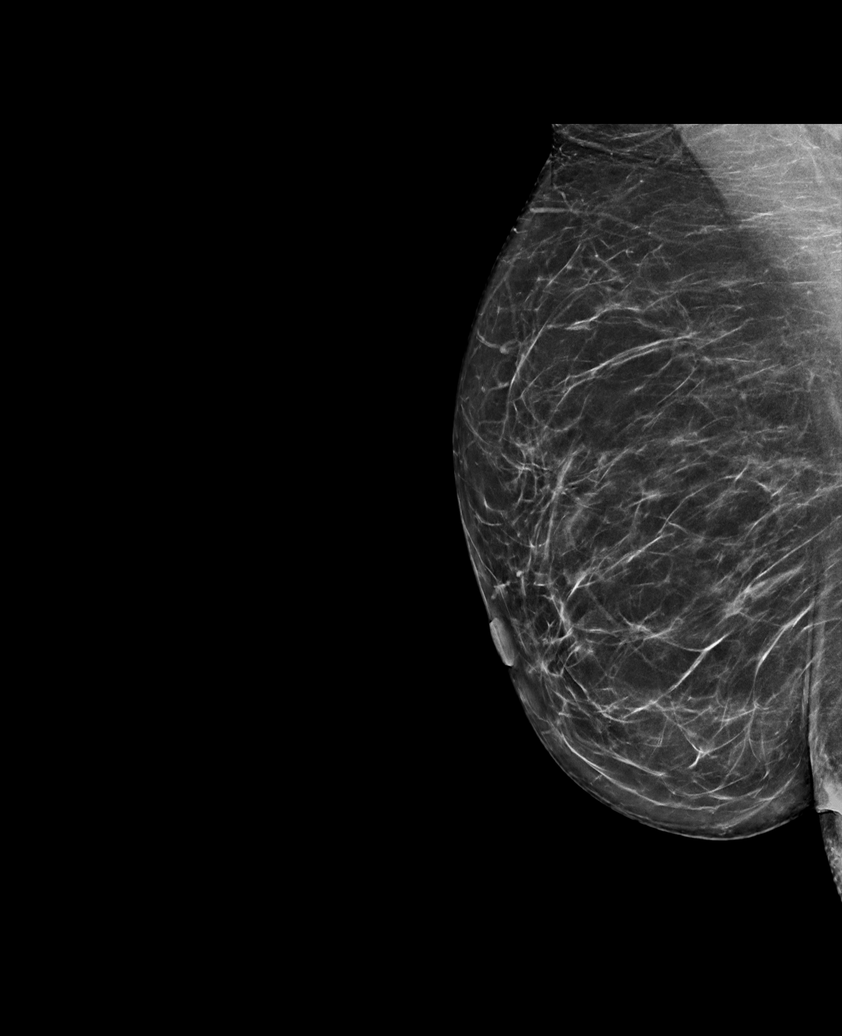

[L CC tomo · tomo slice 38/75.0]
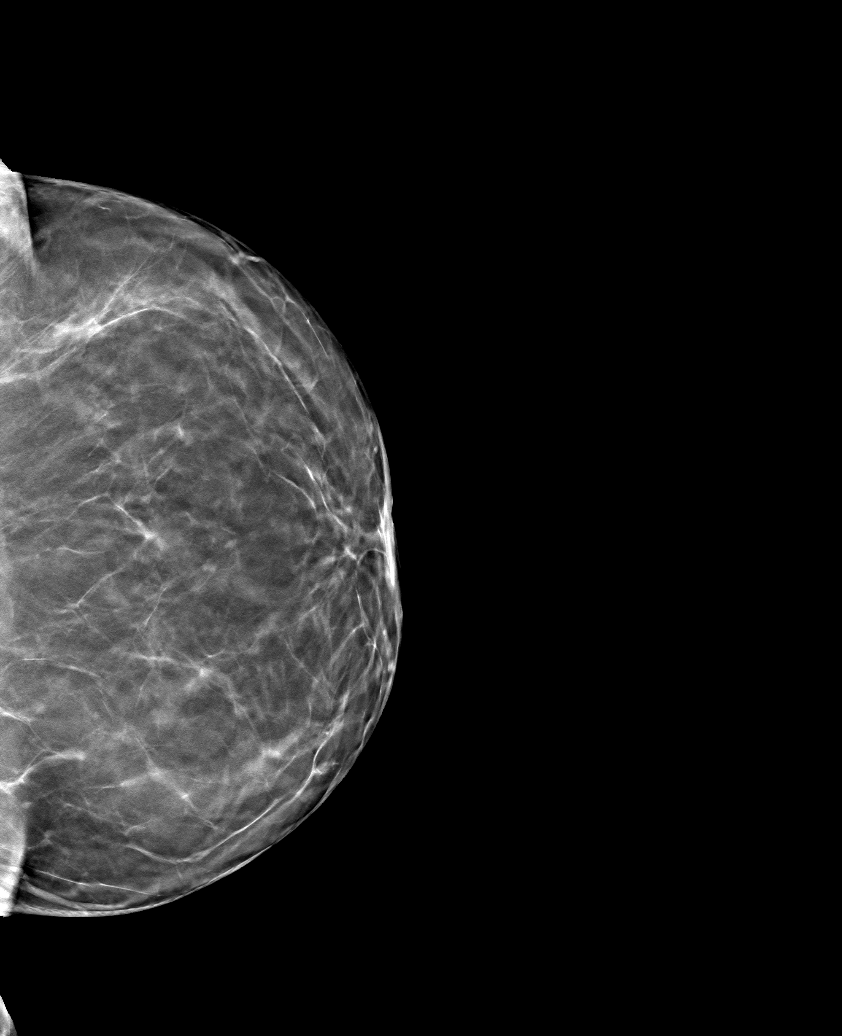

[6 of 30 positions shown; findings below may reference images not displayed]

ACR Breast Density Category b: There are scattered areas of
fibroglandular density.
FINDINGS: There are no findings suspicious for malignancy. Images were
processed with CAD.
IMPRESSION: No mammographic evidence of malignancy. A result letter of this
screening mammogram will be mailed directly to the patient.

RECOMMENDATION:
Screening mammogram in one year. (Code:CN-U-775)

BI-RADS CATEGORY  1: Negative.

## 2020-09-27 DIAGNOSIS — L923 Foreign body granuloma of the skin and subcutaneous tissue: Secondary | ICD-10-CM | POA: Diagnosis not present

## 2020-09-27 DIAGNOSIS — L905 Scar conditions and fibrosis of skin: Secondary | ICD-10-CM | POA: Diagnosis not present

## 2020-11-29 DIAGNOSIS — D225 Melanocytic nevi of trunk: Secondary | ICD-10-CM | POA: Diagnosis not present

## 2020-11-29 DIAGNOSIS — L578 Other skin changes due to chronic exposure to nonionizing radiation: Secondary | ICD-10-CM | POA: Diagnosis not present

## 2020-11-29 DIAGNOSIS — L570 Actinic keratosis: Secondary | ICD-10-CM | POA: Diagnosis not present

## 2021-02-18 ENCOUNTER — Emergency Department (HOSPITAL_BASED_OUTPATIENT_CLINIC_OR_DEPARTMENT_OTHER)
Admission: EM | Admit: 2021-02-18 | Discharge: 2021-02-18 | Disposition: A | Discharge status: Home / Self Care | Payer: BC Managed Care – PPO | Attending: Emergency Medicine | Admitting: Emergency Medicine

## 2021-02-18 ENCOUNTER — Other Ambulatory Visit: Payer: Self-pay

## 2021-02-18 ENCOUNTER — Encounter (HOSPITAL_BASED_OUTPATIENT_CLINIC_OR_DEPARTMENT_OTHER): Payer: Self-pay

## 2021-02-18 ENCOUNTER — Emergency Department (HOSPITAL_BASED_OUTPATIENT_CLINIC_OR_DEPARTMENT_OTHER): Payer: BC Managed Care – PPO | Admitting: Radiology

## 2021-02-18 DIAGNOSIS — R079 Chest pain, unspecified: Secondary | ICD-10-CM | POA: Insufficient documentation

## 2021-02-18 DIAGNOSIS — R0789 Other chest pain: Secondary | ICD-10-CM | POA: Diagnosis not present

## 2021-02-18 LAB — CBC
HCT: 44.1 % (ref 36.0–46.0)
Hemoglobin: 15 g/dL (ref 12.0–15.0)
MCH: 31.3 pg (ref 26.0–34.0)
MCHC: 34 g/dL (ref 30.0–36.0)
MCV: 92.1 fL (ref 80.0–100.0)
Platelets: 302 10*3/uL (ref 150–400)
RBC: 4.79 MIL/uL (ref 3.87–5.11)
RDW: 12.8 % (ref 11.5–15.5)
WBC: 7.5 10*3/uL (ref 4.0–10.5)
nRBC: 0 % (ref 0.0–0.2)

## 2021-02-18 LAB — TROPONIN I (HIGH SENSITIVITY)
Troponin I (High Sensitivity): 2 ng/L (ref <18)
Troponin I (High Sensitivity): 2 ng/L (ref <18)

## 2021-02-18 LAB — BASIC METABOLIC PANEL
Anion gap: 8 (ref 5–15)
BUN: 23 mg/dL (ref 8–23)
CO2: 25 mmol/L (ref 22–32)
Calcium: 9.8 mg/dL (ref 8.9–10.3)
Chloride: 106 mmol/L (ref 98–111)
Creatinine, Ser: 0.63 mg/dL (ref 0.44–1.00)
GFR, Estimated: >60 mL/min (ref >60)
Glucose, Bld: 106 mg/dL — ABNORMAL HIGH (ref 70–99)
Potassium: 3.8 mmol/L (ref 3.5–5.1)
Sodium: 139 mmol/L (ref 135–145)

## 2021-02-18 NOTE — ED Provider Notes (Signed)
[facility] DEPT Provider Note   CSN: [id number] Arrival date & time: 02/18/21  1350     History  Chief Complaint  Patient presents with   Chest Pain    Bridget Elliott is a 66 y.o. female.  HPI  66 year old female with no admitted past medical history presents emergency department for right-sided chest pain.  This is been going on intermittently for approximately 1 week.  She states that the right-sided chest pain is a pressure-like sensation, brief, self resolved, sporadic.  Not exertional.  Never happens at night.  Sometimes related to certain movements.  Sometimes relieved with belching.  Denies any shortness of breath, cough, radiation to her back or abdomen, nausea/vomiting.  No significant cardiac history or history of cardiac disease and direct relatives.  No current risk factors or symptoms of DVT/PE.  Home Medications Prior to Admission medications   Medication Sig Start Date End Date Taking? Authorizing Provider  amoxicillin-clavulanate (AUGMENTIN) 875-125 MG tablet Take 1 tablet by mouth every 12 (twelve) hours. 04/13/20   Panosh, Standley Brooking, MD  cholecalciferol (VITAMIN D) 1000 units tablet Take 2,000 Units by mouth daily.    [provider]  [place] 575 MG/5ML SYRP Take by mouth.    [provider]  Multiple Vitamins-Minerals (EMERGEN-C IMMUNE) PACK Take by mouth daily.    [provider]  valACYclovir (VALTREX) 1000 MG tablet Take 2 tablets (2,000 mg total) by mouth 2 (two) times daily. As needed 11/05/18   Panosh, Standley Brooking, MD      Allergies    Patient has no known allergies.    Review of Systems   Review of Systems  Constitutional:  Negative for fatigue and fever.  Respiratory:  Negative for shortness of breath.   Cardiovascular:  Positive for chest pain. Negative for palpitations and leg swelling.  Gastrointestinal:  Negative for abdominal pain, diarrhea and vomiting.  Musculoskeletal:  Negative for back  pain and neck pain.  Skin:  Negative for rash.  Neurological:  Negative for headaches.   Physical Exam Updated Vital Signs BP (!) 171/92 (BP Location: Left Arm)    Pulse 69    Temp (!) 97.5 F (36.4 C)    Resp 18    Ht 5\' 3"  (1.6 m)    Wt 64.4 kg    SpO2 100%    BMI 25.15 kg/m  Physical Exam Vitals and nursing note reviewed.  Constitutional:      General: She is not in acute distress.    Appearance: Normal appearance. She is not ill-appearing or diaphoretic.  HENT:     Head: Normocephalic.     Mouth/Throat:     Mouth: Mucous membranes are moist.  Cardiovascular:     Rate and Rhythm: Normal rate.  Pulmonary:     Effort: Pulmonary effort is normal. No respiratory distress.     Breath sounds: No decreased breath sounds.  Abdominal:     Palpations: Abdomen is soft.     Tenderness: There is no abdominal tenderness.  Musculoskeletal:     Right lower leg: No edema.     Left lower leg: No edema.  Skin:    General: Skin is warm.  Neurological:     Mental Status: She is alert and oriented to person, place, and time. Mental status is at baseline.  Psychiatric:        Mood and Affect: Mood normal.    ED Results / Procedures / Treatments   Labs (all labs ordered are listed,  but only abnormal results are displayed) Labs Reviewed  BASIC METABOLIC PANEL - Abnormal; Notable for the following components:      Result Value   Glucose, Bld 106 (*)    All other components within normal limits  CBC  TROPONIN I (HIGH SENSITIVITY)  TROPONIN I (HIGH SENSITIVITY)    EKG EKG Interpretation  Date/Time:  Saturday February 18 2021 14:01:48 EST Ventricular Rate:  84 PR Interval:  140 QRS Duration: 78 QT Interval:  368 QTC Calculation: 434 R Axis:   76 Text Interpretation: Normal sinus rhythm Right atrial enlargement Nonspecific ST abnormality Abnormal ECG No previous ECGs available Confirmed by Regan Lemming ([id number]) on 02/18/2021 2:40:52 PM  Radiology DG Chest 2 View  Result Date:  02/18/2021 CLINICAL DATA:  Chest pain EXAM: CHEST - 2 VIEW COMPARISON:  None. FINDINGS: Heart size and mediastinal contours are within normal limits. Lungs are hyperinflated. No suspicious pulmonary opacities identified. No pleural effusion or pneumothorax visualized. No acute osseous abnormality appreciated. IMPRESSION: Hyperinflated lungs.  No focal consolidation identified. Electronically Signed   By: Ofilia Neas M.D.   On: 02/18/2021 14:36    Procedures Procedures    Medications Ordered in ED Medications - No data to display  ED Course/ Medical Decision Making/ A&P                           Medical Decision Making Amount and/or Complexity of Data Reviewed Labs: ordered. Radiology: ordered.   This patient presents to the ED for concern of right-sided chest pain, this involves an extensive number of treatment options, and is a complaint that carries with it a high risk of complications and morbidity.  The differential diagnosis includes ACS, musculoskeletal pain, GI pathology, PE, aortic dissection   Additional history obtained: -Additional history obtained from spouse at bedside -External records from outside source obtained and reviewed including: Chart review including previous notes, labs, imaging, consultation notes   Lab Tests: -I ordered, reviewed, and interpreted labs.  The pertinent results include: Normal CBC/CMP, negative troponin at 2 twice with no delta   EKG -Sinus rhythm without any acute ischemic changes   Imaging Studies ordered: -I ordered imaging studies including chest x-ray -I independently visualized and interpreted imaging which showed no acute pathology -I agree with the radiologist interpretation   ED Course: 65 year old female presents emergency department with atypical right-sided pressure-like chest pain that is brief, self resolved.  Not currently present.  No radiation to back, abdomen/neck.  No symptoms or history suspicious for DVT/PE.   No tachycardia, hypoxia or shortness of breath.  Seems unlikely to be dissection as she is currently pain-free.  She is otherwise low risk for heart disease without any significant heart disease in the family.  EKG shows no acute ischemic changes, troponin is negative x2 in the low was normal with no delta.  Chest x-ray is clear.  Low suspicion for ACS.  Low heart score, plan for outpatient cardiac work-up.   Cardiac Monitoring: The patient was maintained on a cardiac monitor.  I personally viewed and interpreted the cardiac monitored which showed an underlying rhythm of: Sinus rhythm   Reevaluation: After the interventions noted above, I reevaluated the patient and found that they have :resolved   Dispostion: Patient at this time appears safe and stable for discharge and close outpatient follow up. Discharge plan and strict return to ED precautions discussed, patient verbalizes understanding and agreement.        Final  Clinical Impression(s) / ED Diagnoses Final diagnoses:  None    Rx / DC Orders ED Discharge Orders     None         Lorelle Gibbs, DO 02/18/21 1715

## 2021-02-18 NOTE — ED Triage Notes (Signed)
Patient here POV from Home with CP.  Patient has been having CP for approximately 1 week. Pain has remained constant. CP is Mid-Chest and Non-Radiating.   No N/V/D. No SOB.   NAD noted during Triage. A&Ox4. GCS 15. Ambulatory.

## 2021-02-18 NOTE — Discharge Instructions (Signed)
You have been seen and discharged from the emergency department.  Your cardiac and lung work-up was normal today.  Follow-up with your primary provider for further cardiac testing/evaluation and further care. Take home medications as prescribed. If you have any worsening symptoms, worsening chest pain, shortness of breath/difficulty breathing, leg swelling or further concerns for your health please return to an emergency department for further evaluation.

## 2021-02-18 NOTE — ED Triage Notes (Incomplete)
Patient here POV from Home with CP.  Patient has been having CP   NAD noted during Triage. A&Ox4. GCS 15. Ambulatory.

## 2021-02-28 ENCOUNTER — Encounter: Payer: Self-pay | Admitting: Internal Medicine

## 2021-02-28 ENCOUNTER — Other Ambulatory Visit: Payer: Self-pay | Admitting: Internal Medicine

## 2021-02-28 ENCOUNTER — Ambulatory Visit: Payer: BC Managed Care – PPO | Admitting: Internal Medicine

## 2021-02-28 VITALS — BP 122/84 | HR 84 | Temp 98.8°F | Ht 63.0 in | Wt 140.6 lb

## 2021-02-28 DIAGNOSIS — Z0000 Encounter for general adult medical examination without abnormal findings: Secondary | ICD-10-CM

## 2021-02-28 DIAGNOSIS — R142 Eructation: Secondary | ICD-10-CM

## 2021-02-28 DIAGNOSIS — Z87442 Personal history of urinary calculi: Secondary | ICD-10-CM

## 2021-02-28 DIAGNOSIS — R079 Chest pain, unspecified: Secondary | ICD-10-CM

## 2021-02-28 DIAGNOSIS — E785 Hyperlipidemia, unspecified: Secondary | ICD-10-CM

## 2021-02-28 NOTE — Progress Notes (Signed)
Future labs to do before  cpx end feb

## 2021-02-28 NOTE — Progress Notes (Signed)
Chief Complaint  Patient presents with   Chest Pain    Follow up from [facility]    HPI: Meliza Kage 66 y.o. come in for follow-up ED visit last week a dry bridge.   Off and on chst pain for a week before contacting   triage.  She describes as pressure pain right upper mid chest anterior not associated with shortness of breath pleurisy diaphoresis or vomiting but usually gets   burping.  May help she has no dysphagia.  1 call the triage team was directed to the ED which she did the next day because she was out of town otherwise.  Evaluation included a chest x-ray EKG basic labs and troponin x2 was not felt to have high risk for ACS.  No other associated symptoms when difficult it is a 7 out of 10.  She describes the situation as coming and going but uncertain if there are any triggers.  It does not wake her from sleep.  Takes calcium fish oil and elderberry at night before bed with water lays down within about 5 minutes.  Caffine and excess etoh over holidays  may have been much more.  But not currently.  Wonders if some of the symptoms were from that. Past history of kidney stones on the left side. ROS: See pertinent positives and negatives per HPI.  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   Qualifier: History of  By: Danny Lawless CMA, [place]     Hx of varicella    Renal stone    hx of removal and lithotrypsy    Scarlet fever 03/21/2007   Qualifier: History of  By: Danny Lawless CMA, [place]     UTI (urinary tract infection) 10/22/2011    Family History  Problem Relation Age of Onset   Cancer Mother    Hypertension Mother    Hyperlipidemia Mother    Cancer Father        Lung,esophageal   Esophageal cancer Father    Heart attack Maternal Grandfather    Heart attack Paternal Grandfather    Colon cancer Neg Hx    Colon polyps Neg Hx    Rectal cancer Neg Hx    Stomach cancer Neg Hx    Pancreatic cancer Neg Hx     Social History   Socioeconomic  History   Marital status: Married    Spouse name: Not on file   Number of children: Not on file   Years of education: Not on file   Highest education level: Not on file  Occupational History   Not on file  Tobacco Use   Smoking status: Never   Smokeless tobacco: Never  Vaping Use   Vaping Use: Never used  Substance and Sexual Activity   Alcohol use: Yes    Alcohol/week: 0.0 standard drinks    Comment: 5 glasses of wine a week   Drug use: No   Sexual activity: Yes    Partners: Male  Other Topics Concern   Not on file  Social History Narrative   7 hours of sleep per night   Does not work outside the home   Lives alone with her husband   2 Cats in the home   G4P4   LIFESTYLE:    Exercise:     Tobacco/ETS:no   Alcohol: per day  0 - 5 per week    Sugar beverages:   Sleep: 7 hours    Drug use: no  Social Determinants of Health   Financial Resource Strain: Not on file  Food Insecurity: Not on file  Transportation Needs: Not on file  Physical Activity: Not on file  Stress: Not on file  Social Connections: Not on file    Outpatient Medications Prior to Visit  Medication Sig Dispense Refill   Elderberry 575 MG/5ML SYRP Take by mouth.     Multiple Vitamins-Minerals (EMERGEN-C IMMUNE) PACK Take by mouth daily.     valACYclovir (VALTREX) 1000 MG tablet Take 2 tablets (2,000 mg total) by mouth 2 (two) times daily. As needed 30 tablet 5   amoxicillin-clavulanate (AUGMENTIN) 875-125 MG tablet Take 1 tablet by mouth every 12 (twelve) hours. (Patient not taking: Reported on 02/28/2021) 14 tablet 0   cholecalciferol (VITAMIN D) 1000 units tablet Take 2,000 Units by mouth daily. (Patient not taking: Reported on 02/28/2021)     No facility-administered medications prior to visit.     EXAM:  BP 122/84 (BP Location: Left Arm, Patient Position: Sitting, Cuff Size: Normal)    Pulse 84    Temp 98.8 F (37.1 C) (Oral)    Ht 5\' 3"  (1.6 m)    Wt 140 lb 9.6 oz (63.8 kg)    SpO2 98%     BMI 24.91 kg/m   Body mass index is 24.91 kg/m.  GENERAL: vitals reviewed and listed above, alert, oriented, appears well hydrated and in no acute distress HEENT: atraumatic, conjunctiva  clear, no obvious abnormalities on inspection of external nose and ears OP : n masked NECK: no obvious masses on inspection palpation  LUNGS: clear to auscultation bilaterally, no wheezes, rales or rhonchi, good air movement CV: HRRR, no clubbing cyanosis or  peripheral edema nl cap refill  Chest wall no focal lesions points to mid parasternal right chest but negative rib tenderness. Abdomen soft without again a megaly guarding or rebound Skin nonicteric no petechiae. MS: moves all extremities without noticeable focal  abnormality PSYCH: pleasant and cooperative, no obvious depression or anxiety Lab Results  Component Value Date   WBC 7.5 02/18/2021   HGB 15.0 02/18/2021   HCT 44.1 02/18/2021   PLT 302 02/18/2021   GLUCOSE 106 (H) 02/18/2021   CHOL 235 (H) 03/26/2018   TRIG 62.0 03/26/2018   HDL 80.70 03/26/2018   LDLDIRECT 146.9 09/20/2009   LDLCALC 142 (H) 03/26/2018   ALT 21 09/23/2018   AST 18 09/23/2018   NA 139 02/18/2021   K 3.8 02/18/2021   CL 106 02/18/2021   CREATININE 0.63 02/18/2021   BUN 23 02/18/2021   CO2 25 02/18/2021   TSH 1.18 03/26/2018   BP Readings from Last 3 Encounters:  02/28/21 122/84  02/18/21 140/78  04/13/20 136/70   ED record review BMP troponin CBC and chest x-ray were done in addition to her EKG. ASSESSMENT AND PLAN:  Discussed the following assessment and plan:  Recurrent chest pain right  - Plan: US Abdomen Complete  Burping - Plan: US Abdomen Complete  Personal history of kidney stones - Plan: US Abdomen Complete Atypical chest pressure most likely not cardiac with no associated symptoms or change in exercise activity tolerance. Consider esophageal radiating GI Also atypical chest wall. Stop the evening supplements for a few weeks or  longer We will arrange abdominal ultrasound Keep follow-up appointment CPX at the end of February unless worse in interim.  Will add future labs  lfts lipid  crp a1c and tsh ft4  fo next visit  -Patient advised to return or notify  health care team  if  new concerns arise.  Patient Instructions  For 2  weeks  stop the evening   supplements for now   Will arrange  abdominal  ultrasound   to check for gall stones.   Consider adding  acid blocker  like pepcid     but for now  no new meds.   Keep February appt.      Standley Brooking. Jariel Drost M.D.

## 2021-02-28 NOTE — Patient Instructions (Addendum)
For 2  weeks  stop the evening   supplements for now   Will arrange  abdominal  ultrasound   to check for gall stones.   Consider adding  acid blocker  like pepcid     but for now  no new meds.   Keep February appt.

## 2021-03-14 ENCOUNTER — Other Ambulatory Visit: Payer: Self-pay

## 2021-03-14 ENCOUNTER — Ambulatory Visit (HOSPITAL_BASED_OUTPATIENT_CLINIC_OR_DEPARTMENT_OTHER)
Admission: RE | Admit: 2021-03-14 | Discharge: 2021-03-14 | Disposition: A | Discharge status: Home / Self Care | Payer: BC Managed Care – PPO | Source: Ambulatory Visit | Attending: Internal Medicine | Admitting: Internal Medicine

## 2021-03-14 DIAGNOSIS — Z87442 Personal history of urinary calculi: Secondary | ICD-10-CM | POA: Insufficient documentation

## 2021-03-14 DIAGNOSIS — K760 Fatty (change of) liver, not elsewhere classified: Secondary | ICD-10-CM | POA: Diagnosis not present

## 2021-03-14 DIAGNOSIS — R079 Chest pain, unspecified: Secondary | ICD-10-CM | POA: Insufficient documentation

## 2021-03-14 DIAGNOSIS — K824 Cholesterolosis of gallbladder: Secondary | ICD-10-CM | POA: Diagnosis not present

## 2021-03-14 DIAGNOSIS — R142 Eructation: Secondary | ICD-10-CM | POA: Insufficient documentation

## 2021-03-15 ENCOUNTER — Other Ambulatory Visit: Payer: Self-pay | Admitting: Obstetrics and Gynecology

## 2021-03-15 DIAGNOSIS — Z1231 Encounter for screening mammogram for malignant neoplasm of breast: Secondary | ICD-10-CM

## 2021-03-19 NOTE — Progress Notes (Signed)
Small GB polyps and liver cyst  uncertain if  causing sx .  Will review at upcoming  visit

## 2021-03-21 ENCOUNTER — Other Ambulatory Visit (INDEPENDENT_AMBULATORY_CARE_PROVIDER_SITE_OTHER): Payer: BC Managed Care – PPO

## 2021-03-21 DIAGNOSIS — Z0000 Encounter for general adult medical examination without abnormal findings: Secondary | ICD-10-CM | POA: Diagnosis not present

## 2021-03-21 DIAGNOSIS — Z87442 Personal history of urinary calculi: Secondary | ICD-10-CM | POA: Diagnosis not present

## 2021-03-21 DIAGNOSIS — R079 Chest pain, unspecified: Secondary | ICD-10-CM | POA: Diagnosis not present

## 2021-03-21 DIAGNOSIS — E785 Hyperlipidemia, unspecified: Secondary | ICD-10-CM

## 2021-03-21 DIAGNOSIS — R142 Eructation: Secondary | ICD-10-CM | POA: Diagnosis not present

## 2021-03-21 LAB — HEPATIC FUNCTION PANEL
ALT: 18 U/L (ref 0–35)
AST: 18 U/L (ref 0–37)
Albumin: 4.7 g/dL (ref 3.5–5.2)
Alkaline Phosphatase: 86 U/L (ref 39–117)
Bilirubin, Direct: 0.1 mg/dL (ref 0.0–0.3)
Total Bilirubin: 0.7 mg/dL (ref 0.2–1.2)
Total Protein: 7.4 g/dL (ref 6.0–8.3)

## 2021-03-21 LAB — LIPID PANEL
Cholesterol: 240 mg/dL — ABNORMAL HIGH (ref 0–200)
HDL: 77.4 mg/dL (ref >39.00)
LDL Cholesterol: 138 mg/dL — ABNORMAL HIGH (ref 0–99)
NonHDL: 162.84
Total CHOL/HDL Ratio: 3
Triglycerides: 124 mg/dL (ref 0.0–149.0)
VLDL: 24.8 mg/dL (ref 0.0–40.0)

## 2021-03-21 LAB — T4, FREE: Free T4: 0.81 ng/dL (ref 0.60–1.60)

## 2021-03-21 LAB — HEMOGLOBIN A1C: Hgb A1c MFr Bld: 5.9 % (ref 4.6–6.5)

## 2021-03-21 LAB — C-REACTIVE PROTEIN: CRP: <1 mg/dL (ref 0.5–20.0)

## 2021-03-21 LAB — TSH: TSH: 1.66 u[IU]/mL (ref 0.35–5.50)

## 2021-03-27 NOTE — Progress Notes (Signed)
Chief Complaint  Patient presents with   Annual Exam    Fasting     HPI: Patient  Bridget Elliott  66 y.o. comes in today for Preventive Health Care visit  and fu .   Side pain better   not as noticeable  and getting better  Swelling fingers improved temporarily after antibiotic for ear infection etc. thinking about going back to rheumatologist in follow-up. Yearly gyne check planned. Had laboratory test done before visit.  Health Maintenance  Topic Date Due   INFLUENZA VACCINE  08/28/2021 (Originally 08/29/2020)   PAP SMEAR-Modifier  08/28/2021 (Originally 07/05/2017)   Zoster Vaccines- Shingrix (1 of 2) 08/28/2021 (Originally 07/29/1975)   TETANUS/TDAP  09/25/2021 (Originally 07/29/1975)   HIV Screening  09/25/2021 (Originally 07/29/1971)   MAMMOGRAM  03/28/2022   COLONOSCOPY (Pts 45-38yrs Insurance coverage will need to be confirmed)  11/23/2027   Hepatitis C Screening  Completed   HPV VACCINES  Aged Out   Health Maintenance Review LIFESTYLE:  Exercise:  improving after decrease  Tobacco/ETS:n Alcohol: decrased from before Sugar beverages:n Sleep:6-7 hours  Drug use: no HH of 2  considering new pet   ROS:  REST of 12 system review negative except as per HPI no current cp sob syncope  no renal stone sx  bupring better but seems lower in stomach .  No gu vag sx.    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   Qualifier: History of  By: Danny Lawless CMA, [place]     Hx of varicella    Renal stone    hx of removal and lithotrypsy    Scarlet fever 03/21/2007   Qualifier: History of  By: Danny Lawless CMA, [place]     UTI (urinary tract infection) 10/22/2011    Past Surgical History:  Procedure Laterality Date   COLONOSCOPY  1841   kidney stone removal     left calf surgery     x2     TONSILLECTOMY      Family History  Problem Relation Age of Onset   Cancer Mother    Hypertension Mother    Hyperlipidemia Mother    Cancer Father        Lung,esophageal    Esophageal cancer Father    Heart attack Maternal Grandfather    Heart attack Paternal Grandfather    Colon cancer Neg Hx    Colon polyps Neg Hx    Rectal cancer Neg Hx    Stomach cancer Neg Hx    Pancreatic cancer Neg Hx     Social History   Socioeconomic History   Marital status: Married    Spouse name: Not on file   Number of children: Not on file   Years of education: Not on file   Highest education level: Not on file  Occupational History   Not on file  Tobacco Use   Smoking status: Never   Smokeless tobacco: Never  Vaping Use   Vaping Use: Never used  Substance and Sexual Activity   Alcohol use: Yes    Alcohol/week: 0.0 standard drinks    Comment: 5 glasses of wine a week   Drug use: No   Sexual activity: Yes    Partners: Male  Other Topics Concern   Not on file  Social History Narrative   7 hours of sleep per night   Does not work outside the home   Lives alone with her husband   2 Cats in the home  G4P4   LIFESTYLE:    Exercise:     Tobacco/ETS:no   Alcohol: per day  0 - 5 per week    Sugar beverages:   Sleep: 7 hours    Drug use: no   Social Determinants of Radio broadcast assistant Strain: Not on file  Food Insecurity: Not on file  Transportation Needs: Not on file  Physical Activity: Not on file  Stress: Not on file  Social Connections: Not on file    Outpatient Medications Prior to Visit  Medication Sig Dispense Refill   cholecalciferol (VITAMIN D) 1000 units tablet Take 2,000 Units by mouth daily.     Elderberry 575 MG/5ML SYRP Take by mouth.     Multiple Vitamins-Minerals (EMERGEN-C IMMUNE) PACK Take by mouth daily.     valACYclovir (VALTREX) 1000 MG tablet Take 2 tablets (2,000 mg total) by mouth 2 (two) times daily. As needed 30 tablet 5   amoxicillin-clavulanate (AUGMENTIN) 875-125 MG tablet Take 1 tablet by mouth every 12 (twelve) hours. (Patient not taking: Reported on 02/28/2021) 14 tablet 0   No facility-administered  medications prior to visit.     EXAM:  BP 126/82 (BP Location: Left Arm, Patient Position: Sitting, Cuff Size: Normal)    Pulse 77    Temp 98.6 F (37 C) (Oral)    Ht 5' 4.75" (1.645 m)    Wt 141 lb 3.2 oz (64 kg)    SpO2 98%    BMI 23.68 kg/m   Body mass index is 23.68 kg/m. Wt Readings from Last 3 Encounters:  03/28/21 141 lb 3.2 oz (64 kg)  02/28/21 140 lb 9.6 oz (63.8 kg)  02/18/21 141 lb 15.6 oz (64.4 kg)    Physical Exam: Vital signs reviewed QJJ:HERD is a well-developed well-nourished alert cooperative    who appearsr stated age in no acute distress.  HEENT: normocephalic atraumatic , Eyes: PERRL EOM's full, conjunctiva clear, Nares: paten,t no deformity discharge or tenderness., Ears: no deformity EAC's clear TMs with normal landmarks. Mouth:maskedNECK: supple without masses, thyromegaly or bruits. CHEST/PULM:  Clear to auscultation and percussion breath sounds equal no wheeze , rales or rhonchi. No chest wall deformities or tenderness. Breast: normal by inspection . No dimpling, discharge, masses, tenderness or discharge . CV: PMI is nondisplaced, S1 S2 no gallops, murmurs, rubs. Peripheral pulses are full without delay. .  ABDOMEN: Bowel sounds normal nontender  No guard or rebound, no hepato splenomegal no CVA tenderness. Extremtities:  No clubbing cyanosis or edema, no acute joint swelling or redness no focal atrophy NEURO:  Oriented x3, cranial nerves 3-12 appear to be intact, no obvious focal weakness,gait within normal limits no abnormal reflexes or asymmetrical SKIN: No acute rashes normal turgor, color, no bruising or petechiae. PSYCH: Oriented, good eye contact, no obvious depression anxiety, cognition and judgment appear normal. LN: no cervical axillary adenopathy  Lab Results  Component Value Date   WBC 7.5 02/18/2021   HGB 15.0 02/18/2021   HCT 44.1 02/18/2021   PLT 302 02/18/2021   GLUCOSE 106 (H) 02/18/2021   CHOL 240 (H) 03/21/2021   TRIG 124.0  03/21/2021   HDL 77.40 03/21/2021   LDLDIRECT 146.9 09/20/2009   LDLCALC 138 (H) 03/21/2021   ALT 18 03/21/2021   AST 18 03/21/2021   NA 139 02/18/2021   K 3.8 02/18/2021   CL 106 02/18/2021   CREATININE 0.63 02/18/2021   BUN 23 02/18/2021   CO2 25 02/18/2021   TSH 1.66 03/21/2021   HGBA1C  5.9 03/21/2021    BP Readings from Last 3 Encounters:  03/28/21 126/82  02/28/21 122/84  02/18/21 140/78   The 10-year ASCVD risk score (Arnett DK, et al., 2019) is: 4.6%   Values used to calculate the score:     Age: 44 years     Sex: Female     Is Non-Hispanic African American: No     Diabetic: No     Tobacco smoker: No     Systolic Blood Pressure: 761 mmHg     Is BP treated: No     HDL Cholesterol: 77.4 mg/dL     Total Cholesterol: 240 mg/dL  Lab results reviewed with patient  IMPRESSION: 1. Mild fatty liver. 2. A focus of increased echogenicity and a complex/septated cyst within the liver as above. Follow-up with ultrasound in 3 months recommended. 3. A 4 mm gallbladder polyp.     Electronically Signed   By: Anner Crete M.D.   On: 03/14/2021 20:10 ASSESSMENT AND PLAN:  Discussed the following assessment and plan:    ICD-10-CM   1. Visit for preventive health examination  Z00.00     2. Gallbladder polyp  K82.4 US Abdomen Complete   small 4 mm    3. Liver cyst  K76.89 US Abdomen Complete    4. Elevated LDL cholesterol level  E78.00    ascvd risk 4.6    Continue lifestyle intervention healthy eating and exercise . Fu [place] in 3 mos  Return for depending on results,  yearly  check .  and 3 mos ultrasound. .  Patient Care Team: Kaylea Mounsey, Standley Brooking, MD as PCP - General (Internal Medicine) Molli Posey, MD as Consulting Physician (Obstetrics and Gynecology) [facility] (Ophthalmology) Jari Pigg, MD as Consulting Physician (Dermatology) Patient Instructions  Good to see  you today . Plan repeat US in about 3 months to check the  cyst .  In your  liver  Gall bladder polyp can be followed yearly . Plan Continue lifestyle intervention healthy eating and exercise .   The 10-year ASCVD risk score (Arnett DK, et al., 2019) is: 4.6%   Values used to calculate the score:     Age: 52 years     Sex: Female     Is Non-Hispanic African American: No     Diabetic: No     Tobacco smoker: No     Systolic Blood Pressure: 950 mmHg     Is BP treated: No     HDL Cholesterol: 77.4 mg/dL     Total Cholesterol: 240 mg/dL  Consider getting back to rheumatology if   hand finger swelling  progressive.    Standley Brooking. Juline Sanderford M.D.

## 2021-03-28 ENCOUNTER — Encounter: Payer: Self-pay | Admitting: Internal Medicine

## 2021-03-28 ENCOUNTER — Ambulatory Visit (INDEPENDENT_AMBULATORY_CARE_PROVIDER_SITE_OTHER): Payer: BC Managed Care – PPO | Admitting: Internal Medicine

## 2021-03-28 VITALS — BP 126/82 | HR 77 | Temp 98.6°F | Ht 64.75 in | Wt 141.2 lb

## 2021-03-28 DIAGNOSIS — K824 Cholesterolosis of gallbladder: Secondary | ICD-10-CM | POA: Diagnosis not present

## 2021-03-28 DIAGNOSIS — K7689 Other specified diseases of liver: Secondary | ICD-10-CM

## 2021-03-28 DIAGNOSIS — Z0000 Encounter for general adult medical examination without abnormal findings: Secondary | ICD-10-CM | POA: Diagnosis not present

## 2021-03-28 DIAGNOSIS — E7800 Pure hypercholesterolemia, unspecified: Secondary | ICD-10-CM | POA: Diagnosis not present

## 2021-03-28 NOTE — Patient Instructions (Addendum)
Good to see  you today . Plan repeat US in about 3 months to check the  cyst .  In your liver  Gall bladder polyp can be followed yearly . Plan Continue lifestyle intervention healthy eating and exercise .   The 10-year ASCVD risk score (Arnett DK, et al., 2019) is: 4.6%   Values used to calculate the score:     Age: 66 years     Sex: Female     Is Non-Hispanic African American: No     Diabetic: No     Tobacco smoker: No     Systolic Blood Pressure: 862 mmHg     Is BP treated: No     HDL Cholesterol: 77.4 mg/dL     Total Cholesterol: 240 mg/dL  Consider getting back to rheumatology if   hand finger swelling  progressive.

## 2021-03-29 ENCOUNTER — Other Ambulatory Visit: Payer: Self-pay

## 2021-03-29 ENCOUNTER — Ambulatory Visit
Admission: RE | Admit: 2021-03-29 | Discharge: 2021-03-29 | Disposition: A | Discharge status: Home / Self Care | Payer: BC Managed Care – PPO | Source: Ambulatory Visit | Attending: Obstetrics and Gynecology | Admitting: Obstetrics and Gynecology

## 2021-03-29 DIAGNOSIS — Z1231 Encounter for screening mammogram for malignant neoplasm of breast: Secondary | ICD-10-CM

## 2021-05-25 DIAGNOSIS — L720 Epidermal cyst: Secondary | ICD-10-CM | POA: Diagnosis not present

## 2021-05-25 DIAGNOSIS — L719 Rosacea, unspecified: Secondary | ICD-10-CM | POA: Diagnosis not present

## 2021-05-25 DIAGNOSIS — L821 Other seborrheic keratosis: Secondary | ICD-10-CM | POA: Diagnosis not present

## 2021-05-31 DIAGNOSIS — Z13820 Encounter for screening for osteoporosis: Secondary | ICD-10-CM | POA: Diagnosis not present

## 2021-05-31 DIAGNOSIS — Z01419 Encounter for gynecological examination (general) (routine) without abnormal findings: Secondary | ICD-10-CM | POA: Diagnosis not present

## 2021-05-31 DIAGNOSIS — Z6823 Body mass index (BMI) 23.0-23.9, adult: Secondary | ICD-10-CM | POA: Diagnosis not present

## 2021-06-07 DIAGNOSIS — D251 Intramural leiomyoma of uterus: Secondary | ICD-10-CM | POA: Diagnosis not present

## 2021-06-07 DIAGNOSIS — D485 Neoplasm of uncertain behavior of skin: Secondary | ICD-10-CM | POA: Diagnosis not present

## 2021-06-07 DIAGNOSIS — L578 Other skin changes due to chronic exposure to nonionizing radiation: Secondary | ICD-10-CM | POA: Diagnosis not present

## 2021-06-07 DIAGNOSIS — L821 Other seborrheic keratosis: Secondary | ICD-10-CM | POA: Diagnosis not present

## 2021-06-07 DIAGNOSIS — L719 Rosacea, unspecified: Secondary | ICD-10-CM | POA: Diagnosis not present

## 2021-06-07 DIAGNOSIS — R1909 Other intra-abdominal and pelvic swelling, mass and lump: Secondary | ICD-10-CM | POA: Diagnosis not present

## 2021-06-07 DIAGNOSIS — D225 Melanocytic nevi of trunk: Secondary | ICD-10-CM | POA: Diagnosis not present

## 2021-06-07 DIAGNOSIS — L570 Actinic keratosis: Secondary | ICD-10-CM | POA: Diagnosis not present

## 2021-06-07 DIAGNOSIS — D2272 Melanocytic nevi of left lower limb, including hip: Secondary | ICD-10-CM | POA: Diagnosis not present

## 2021-06-12 ENCOUNTER — Ambulatory Visit: Payer: BC Managed Care – PPO | Admitting: Family Medicine

## 2021-06-12 VITALS — BP 124/72 | HR 90 | Temp 98.9°F | Wt 141.2 lb

## 2021-06-12 DIAGNOSIS — R300 Dysuria: Secondary | ICD-10-CM | POA: Diagnosis not present

## 2021-06-12 DIAGNOSIS — R3989 Other symptoms and signs involving the genitourinary system: Secondary | ICD-10-CM | POA: Diagnosis not present

## 2021-06-12 DIAGNOSIS — Z87442 Personal history of urinary calculi: Secondary | ICD-10-CM | POA: Diagnosis not present

## 2021-06-12 LAB — POCT URINALYSIS DIPSTICK
Bilirubin, UA: NEGATIVE
Glucose, UA: NEGATIVE
Ketones, UA: NEGATIVE
Nitrite, UA: NEGATIVE
Protein, UA: POSITIVE — AB
Spec Grav, UA: >=1.03 — AB (ref 1.010–1.025)
Urobilinogen, UA: NEGATIVE E.U./dL — AB
pH, UA: 6 (ref 5.0–8.0)

## 2021-06-12 MED ORDER — NITROFURANTOIN MONOHYD MACRO 100 MG PO CAPS: 100.0000 mg | ORAL_CAPSULE | Freq: Two times a day (BID) | ORAL | 0 refills | Status: AC

## 2021-06-12 NOTE — Progress Notes (Signed)
Subjective:  ? ? Patient ID: Bridget Elliott, female    DOB: Nov 01, 1955, 66 y.o.   MRN: [id number] ? ?Chief Complaint  ?Patient presents with  ? Urinary Tract Infection  ?  Burning, pain and some pink in urine started yday. Took home uti test and got results that urine has leuks and to see health care provider.  ? ? ?HPI ?Patient was seen today for acute concern.  Patient endorses dysuria and suprapubic pressure x1 day.  Patient also noticed light pink discoloration on tissue when wiping.  Tried OTC Uristat which came with a urine test.  The home urine test revealed leuks and advised pt to follow-up with her provider.  Pt denies nausea, vomiting, fever, chills, constipation, back pain.  Patient does have a history of renal calculi requiring lithotripsy.  Drinking mostly tea and seltzer water daily. ? ?Past Medical History:  ?Diagnosis Date  ? 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 Qualifier: History of  By: [place], [place]    ? Hx of varicella   ? Renal stone   ? hx of removal and lithotrypsy   ? Scarlet fever 03/21/2007  ? Qualifier: History of  By: [place], [place]    ? UTI (urinary tract infection) 10/22/2011  ? ? ?No Known Allergies ? ?ROS ?General: Denies fever, chills, night sweats, changes in weight, changes in appetite ?HEENT: Denies headaches, ear pain, changes in vision, rhinorrhea, sore throat ?CV: Denies CP, palpitations, SOB, orthopnea ?Pulm: Denies SOB, cough, wheezing ?GI: Denies abdominal pain, nausea, vomiting, diarrhea, constipation + suprapubic pressure ?GU: Denies dysuria, hematuria, frequency, vaginal discharge + dysuria, blood on toilet tissue ?Msk: Denies muscle cramps, joint pains ?Neuro: Denies weakness, numbness, tingling ?Skin: Denies rashes, bruising ?Psych: Denies depression, anxiety, hallucinations ?   ?Objective:  ?  ?Blood pressure 124/72, pulse 90, temperature 98.9 ?F (37.2 ?C), temperature source Oral, weight 141 lb 3.2 oz (64 kg), SpO2 95 %. ? ?Gen. Pleasant, well-nourished, in no distress,  normal affect   ?HEENT: [place]/AT, face symmetric, conjunctiva clear, no scleral icterus, PERRLA, EOMI, nares patent without drainage. ?Lungs: no accessory muscle use ?Cardiovascular: RRR, no peripheral edema ?Abdomen: BS present, soft, NT/ND, no hepatosplenomegaly.  No CVA tenderness. ?Musculoskeletal: No deformities, no cyanosis or clubbing, normal tone ?Neuro:  A&Ox3, CN II-XII intact, normal gait ?Skin:  Warm, no lesions/ rash ? ? ?Wt Readings from Last 3 Encounters:  ?03/28/21 141 lb 3.2 oz (64 kg)  ?02/28/21 140 lb 9.6 oz (63.8 kg)  ?02/18/21 141 lb 15.6 oz (64.4 kg)  ? ? ?Lab Results  ?Component Value Date  ? WBC 7.5 02/18/2021  ? HGB 15.0 02/18/2021  ? HCT 44.1 02/18/2021  ? PLT 302 02/18/2021  ? GLUCOSE 106 (H) 02/18/2021  ? CHOL 240 (H) 03/21/2021  ? TRIG 124.0 03/21/2021  ? HDL 77.40 03/21/2021  ? LDLDIRECT 146.9 09/20/2009  ? LDLCALC 138 (H) 03/21/2021  ? ALT 18 03/21/2021  ? AST 18 03/21/2021  ? NA 139 02/18/2021  ? K 3.8 02/18/2021  ? CL 106 02/18/2021  ? CREATININE 0.63 02/18/2021  ? BUN 23 02/18/2021  ? CO2 25 02/18/2021  ? TSH 1.66 03/21/2021  ? HGBA1C 5.9 03/21/2021  ? ? ?Assessment/Plan: ? ?Dysuria  ?-POC UA with 3+ RBCs, 1+ leuks, SG greater than 1.030, and protein. ?-Will obtian UCx ?- Plan: POCT urinalysis dipstick, nitrofurantoin, macrocrystal-monohydrate, (MACROBID) 100 MG capsule, Culture, Urine ? ?Suspected UTI  ?-POC UA with 3+ RBCs, 1+ leuks, protein, SG greater than 1.030 ?-send for urine culture ?-  Start Macrobid while waiting on culture results. ?-Patient advised to increase p.o. intake of water and fluids ?-Given strict precautions ?- Plan: nitrofurantoin, macrocrystal-monohydrate, (MACROBID) 100 MG capsule, Culture, Urine ? ?Personal history of kidney stones ?-Per chart review CT renal stone study from 03/10/2019 4-5 nonobstructing stones in each kidney.  Largest stone in the lower pole of the right kidney and measures 0.8 cm in diameter.  Bilateral nonobstructing renal stones without  ureteral stone or hydronephrosis ?-Patient to increase p.o. intake of water and fluids ?-Monitor for any pain that starts to move ? ?F/u prn ? ?Grier Mitts, MD ?

## 2021-06-12 NOTE — Patient Instructions (Signed)
Prescription for Macrobid was sent to your pharmacy.  Your urine will be sent for culture.  If changes to your antibiotic are needed we will notify you based on the culture results.  Continue to monitor for any pain that starts to move as this could be another kidney stone. ?

## 2021-06-14 LAB — URINE CULTURE
MICRO NUMBER:: 13396417
SPECIMEN QUALITY:: ADEQUATE

## 2021-06-27 ENCOUNTER — Ambulatory Visit
Admission: RE | Admit: 2021-06-27 | Discharge: 2021-06-27 | Disposition: A | Discharge status: Home / Self Care | Payer: BC Managed Care – PPO | Source: Ambulatory Visit | Attending: Internal Medicine | Admitting: Internal Medicine

## 2021-06-27 DIAGNOSIS — K824 Cholesterolosis of gallbladder: Secondary | ICD-10-CM

## 2021-06-27 DIAGNOSIS — K7689 Other specified diseases of liver: Secondary | ICD-10-CM

## 2021-06-27 DIAGNOSIS — R160 Hepatomegaly, not elsewhere classified: Secondary | ICD-10-CM | POA: Diagnosis not present

## 2021-06-29 ENCOUNTER — Other Ambulatory Visit: Payer: Self-pay

## 2021-06-29 ENCOUNTER — Encounter: Payer: Self-pay | Admitting: Internal Medicine

## 2021-06-29 DIAGNOSIS — K769 Liver disease, unspecified: Secondary | ICD-10-CM

## 2021-06-29 NOTE — Progress Notes (Signed)
US shows  stable findings  in liver and gall bladder  polyp .  However  radiologist advises  another abdominal US in 3-6 months  to follow [place] please order an abdominal US  to check liver lesion for 3-6 months

## 2021-07-11 ENCOUNTER — Encounter: Payer: Self-pay | Admitting: Internal Medicine

## 2022-05-07 ENCOUNTER — Other Ambulatory Visit: Payer: Self-pay | Admitting: Obstetrics and Gynecology

## 2022-05-07 DIAGNOSIS — Z1231 Encounter for screening mammogram for malignant neoplasm of breast: Secondary | ICD-10-CM

## 2022-06-13 ENCOUNTER — Ambulatory Visit: Payer: BC Managed Care – PPO

## 2022-07-12 ENCOUNTER — Ambulatory Visit: Payer: BC Managed Care – PPO

## 2022-07-19 DIAGNOSIS — K080 Exfoliation of teeth due to systemic causes: Secondary | ICD-10-CM | POA: Diagnosis not present

## 2022-07-24 ENCOUNTER — Ambulatory Visit
Admission: RE | Admit: 2022-07-24 | Discharge: 2022-07-24 | Disposition: A | Discharge status: Home / Self Care | Payer: Medicare Other | Source: Ambulatory Visit | Attending: Obstetrics and Gynecology | Admitting: Obstetrics and Gynecology

## 2022-07-24 DIAGNOSIS — Z1231 Encounter for screening mammogram for malignant neoplasm of breast: Secondary | ICD-10-CM | POA: Diagnosis not present

## 2022-07-25 ENCOUNTER — Ambulatory Visit (INDEPENDENT_AMBULATORY_CARE_PROVIDER_SITE_OTHER): Payer: Medicare Other | Admitting: Family Medicine

## 2022-07-25 VITALS — BP 134/70 | HR 99 | Temp 97.9°F | Ht 64.75 in | Wt 137.1 lb

## 2022-07-25 DIAGNOSIS — I351 Nonrheumatic aortic (valve) insufficiency: Secondary | ICD-10-CM | POA: Diagnosis not present

## 2022-07-25 DIAGNOSIS — R300 Dysuria: Secondary | ICD-10-CM

## 2022-07-25 LAB — POC URINALSYSI DIPSTICK (AUTOMATED)
Glucose, UA: POSITIVE — AB
Nitrite, UA: POSITIVE
Protein, UA: POSITIVE — AB
Spec Grav, UA: 1.02 (ref 1.010–1.025)
Urobilinogen, UA: >=8 E.U./dL — AB
pH, UA: 5 (ref 5.0–8.0)

## 2022-07-25 MED ORDER — CEPHALEXIN 500 MG PO CAPS: 500.0000 mg | ORAL_CAPSULE | Freq: Three times a day (TID) | ORAL | 0 refills | Status: DC

## 2022-07-25 NOTE — Progress Notes (Signed)
Established Patient Office Visit  Subjective   Patient ID: Bridget Elliott, female    DOB: 07-09-1955  Age: 67 y.o. MRN: [id number]  No chief complaint on file.   HPI   Bridget Elliott is seen with concern for possible UTI.  She started having some mild burning 2 days ago and especially yesterday.  She did a home screen which came back positive suggesting UTI.  Denies any fever.  No flank pain.  No nausea or vomiting.  She had UTI last May with E. coli and then also E. coli UTI in February 2022.  No gross hematuria.  No known drug allergies.  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 She has never used smokeless tobacco. She reports current alcohol use. She reports that she does not use drugs. family history includes Cancer in her father and mother; Esophageal cancer in her father; Heart attack in her maternal grandfather and paternal grandfather; Hyperlipidemia in her mother; Hypertension in her mother. No Known Allergies  Review of Systems  Constitutional:  Negative for chills and fever.  Gastrointestinal:  Negative for nausea and vomiting.  Genitourinary:  Positive for dysuria and frequency. Negative for flank pain and hematuria.      Objective:     BP 134/70 (BP Location: Left Arm, Patient Position: Sitting, Cuff Size: Normal)   Pulse 99   Temp 97.9 F (36.6 C) (Oral)   Ht 5' 4.75" (1.645 m)   Wt 137 lb 1.6 oz (62.2 kg)   SpO2 98%   BMI 22.99 kg/m  BP Readings from Last 3 Encounters:  07/25/22 134/70  06/12/21 124/72  03/28/21 126/82    Wt Readings from Last 3 Encounters:  07/25/22 137 lb 1.6 oz (62.2 kg)  06/12/21 141 lb 3.2 oz (64 kg)  03/28/21 141 lb 3.2 oz (64 kg)      Physical Exam Vitals reviewed.  Constitutional:      General: She is not in acute distress.    Appearance: Normal appearance. She is not ill-appearing.  Cardiovascular:     Rate and Rhythm: Normal rate and regular rhythm.     Comments: She has soft 2/6 systolic ejection murmur right upper sternal border. Pulmonary:     Effort: Pulmonary effort is normal.     Breath sounds: Normal breath sounds. No wheezing or rales.  Neurological:     Mental Status: She is alert.      Results for orders placed or performed in visit on 07/25/22  POCT Urinalysis Dipstick (Automated)  Result Value Ref Range   Color, UA orange    Clarity, UA cloudy    Glucose, UA Positive (A) Negative   Bilirubin, UA 3+    Ketones, UA 1+    Spec Grav, UA 1.020 1.010 - 1.025   Blood, UA 3+    pH, UA 5.0 5.0 - 8.0   Protein, UA Positive (A)  Negative   Urobilinogen, UA >=8.0 (A) 0.2 or 1.0 E.U./dL   Nitrite, UA positive    Leukocytes, UA Large (3+) (A) Negative      The 10-year ASCVD risk score (Arnett DK, et al., 2019) is: 5.8%    Assessment & Plan:   #1   2-day history of dysuria.  Home screen suggested UTI.  Her dipstick today was difficult to assess because of taking some over-the-counter Pyridium. -Urine culture sent -Start Keflex 500 mg 3 times daily for 7 days pending culture results -Stay well-hydrated -Follow-up immediately for any fever or worsening symptoms  #2 heart murmur auscultated over aortic valve region.  This is soft 2/6 systolic murmur.  Patient asymptomatic.  She will follow-up with primary for physical later this year and have reauscultated.  Consider echo at some point to further assess   No follow-ups on file.    Evelena Peat, MD

## 2022-07-31 DIAGNOSIS — D2272 Melanocytic nevi of left lower limb, including hip: Secondary | ICD-10-CM | POA: Diagnosis not present

## 2022-07-31 DIAGNOSIS — Z86006 Personal history of melanoma in-situ: Secondary | ICD-10-CM | POA: Diagnosis not present

## 2022-07-31 DIAGNOSIS — L821 Other seborrheic keratosis: Secondary | ICD-10-CM | POA: Diagnosis not present

## 2022-07-31 DIAGNOSIS — L578 Other skin changes due to chronic exposure to nonionizing radiation: Secondary | ICD-10-CM | POA: Diagnosis not present

## 2022-07-31 DIAGNOSIS — D225 Melanocytic nevi of trunk: Secondary | ICD-10-CM | POA: Diagnosis not present

## 2022-07-31 DIAGNOSIS — D485 Neoplasm of uncertain behavior of skin: Secondary | ICD-10-CM | POA: Diagnosis not present

## 2022-08-06 DIAGNOSIS — Z6823 Body mass index (BMI) 23.0-23.9, adult: Secondary | ICD-10-CM | POA: Diagnosis not present

## 2022-08-06 DIAGNOSIS — Z01419 Encounter for gynecological examination (general) (routine) without abnormal findings: Secondary | ICD-10-CM | POA: Diagnosis not present

## 2022-08-29 ENCOUNTER — Encounter (INDEPENDENT_AMBULATORY_CARE_PROVIDER_SITE_OTHER): Payer: Self-pay

## 2022-09-03 ENCOUNTER — Ambulatory Visit (INDEPENDENT_AMBULATORY_CARE_PROVIDER_SITE_OTHER): Payer: Medicare Other

## 2022-09-03 DIAGNOSIS — Z0000 Encounter for general adult medical examination without abnormal findings: Secondary | ICD-10-CM

## 2022-09-03 DIAGNOSIS — K080 Exfoliation of teeth due to systemic causes: Secondary | ICD-10-CM | POA: Diagnosis not present

## 2022-09-03 NOTE — Progress Notes (Addendum)
Subjective:   Bridget Elliott is a 67 y.o. female who presents for an Initial Medicare Annual Wellness Visit.  Visit Complete: NOT IN PERSON I connected with  Bridget Elliott on 09/03/2022 by a audio enabled telemedicine application and verified that I am speaking with the correct person using two identifiers.  Patient Location: Home  Provider Location: Home Office  I discussed the limitations of evaluation and management by telemedicine. The patient expressed understanding and agreed to proceed.  Patient Medicare AWV questionnaire was completed by the patient on 09/03/2022; I have confirmed that all information answered by patient is correct and no changes since this date.  Cardiac Risk Factors include: none     Objective:    There were no vitals filed for this visit. There is no height or weight on file to calculate BMI.     09/03/2022    8:59 AM 02/18/2021    2:07 PM  Advanced Directives  Does Patient Have a Medical Advance Directive? Yes No  Does patient want to make changes to medical advance directive? Yes (ED - Information included in AVS)   Would patient like information on creating a medical advance directive?  No - Patient declined    Current Medications (verified) Outpatient Encounter Medications as of 09/03/2022  Medication Sig   cholecalciferol (VITAMIN D) 1000 units tablet Take 2,000 Units by mouth daily.   Elderberry 575 MG/5ML SYRP Take by mouth.   Multiple Vitamins-Minerals (EMERGEN-C IMMUNE) PACK Take by mouth daily.   valACYclovir (VALTREX) 1000 MG tablet Take 2 tablets (2,000 mg total) by mouth 2 (two) times daily. As needed   cephALEXin (KEFLEX) 500 MG capsule Take 1 capsule (500 mg total) by mouth 3 (three) times daily. (Patient not taking: Reported on 09/03/2022)   No facility-administered encounter medications on file as of 09/03/2022.    Allergies (verified) Patient has no known allergies.   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   Qualifier: History of  By: Genelle Gather CMA, [place]     Hx of varicella    Renal stone    hx of removal and lithotrypsy    Scarlet fever 03/21/2007   Qualifier: History of  By: Genelle Gather CMA, [place]     UTI (urinary tract infection) 10/22/2011   Past Surgical History:  Procedure Laterality Date   COLONOSCOPY  1841   kidney stone removal     left calf surgery     x2     TONSILLECTOMY     Family History  Problem Relation Age of Onset   Cancer Mother    Hypertension Mother    Hyperlipidemia Mother    Cancer Father        Lung,esophageal   Esophageal cancer Father    Heart attack Maternal Grandfather    Heart attack Paternal Grandfather    Colon cancer Neg Hx    Colon polyps Neg Hx    Rectal cancer Neg Hx    Stomach cancer Neg Hx    Pancreatic cancer Neg Hx    Social History   Socioeconomic History   Marital status: Married    Spouse name: Not on file   Number of children: Not on file   Years of education: Not on file   Highest education level: Bachelor's degree (e.g., BA, AB, BS)  Occupational History   Not on file  Tobacco Use   Smoking status: Never   Smokeless tobacco: Never  Vaping Use   Vaping status:  Never Used  Substance and Sexual Activity   Alcohol use: Yes    Alcohol/week: 0.0 standard drinks of alcohol    Comment: 5 glasses of wine a week   Drug use: No   Sexual activity: Yes    Partners: Male  Other Topics Concern   Not on file  Social History Narrative   7 hours of sleep per night   Does not work outside the home   Lives alone with her husband   2 Cats in the home   G4P4   LIFESTYLE:    Exercise:     Tobacco/ETS:no   Alcohol: per day  0 - 5 per week    Sugar beverages:   Sleep: 7 hours    Drug use: no   Social Determinants of Health   Financial Resource Strain: Low Risk  (09/03/2022)   Overall Financial Resource Strain (CARDIA)    Difficulty of Paying Living Expenses: Not hard at all  Food Insecurity: No Food Insecurity  (09/03/2022)   Hunger Vital Sign    Worried About Running Out of Food in the Last Year: Never true    Ran Out of Food in the Last Year: Never true  Transportation Needs: Unknown (09/03/2022)   PRAPARE - Administrator, Civil Service (Medical): Not on file    Lack of Transportation (Non-Medical): No  Physical Activity: Sufficiently Active (09/03/2022)   Exercise Vital Sign    Days of Exercise per Week: 7 days    Minutes of Exercise per Session: 40 min  Stress: No Stress Concern Present (09/03/2022)   [employer] of Occupational Health - Occupational Stress Questionnaire    Feeling of Stress : Not at all  Social Connections: Socially Integrated (09/03/2022)   Social Connection and Isolation Panel [NHANES]    Frequency of Communication with Friends and Family: More than three times a week    Frequency of Social Gatherings with Friends and Family: More than three times a week    Attends Religious Services: More than 4 times per year    Active Member of [employer] or Organizations: Yes    Attends Engineer, structural: More than 4 times per year    Marital Status: Married    Tobacco Counseling Counseling given: Not Answered   Clinical Intake:  Pre-visit preparation completed: Yes  Pain : No/denies pain     Diabetes: No  How often do you need to have someone help you when you read instructions, pamphlets, or other written materials from your doctor or pharmacy?: 1 - Never What is the last grade level you completed in school?: [employer]HEL[employer]  Interpreter Needed?: No      Activities of Daily Living    09/03/2022    8:54 AM  In your present state of health, do you have any difficulty performing the following activities:  Hearing? 0  Vision? 0  Difficulty concentrating or making decisions? 0  Walking or climbing stairs? 0  Dressing or bathing? 0  Doing errands, shopping? 0  Preparing Food and eating ? N  Using the Toilet? N  In the past six months, have you  accidently leaked urine? N  Do you have problems with loss of bowel control? N  Managing your Medications? N  Housekeeping or managing your Housekeeping? N    Patient Care Team: Panosh, Neta Mends, MD as PCP - General (Internal Medicine) Richarda Overlie, MD as Consulting Physician (Obstetrics and Gynecology) [facility] (Ophthalmology) Elmon Else, MD as Consulting Physician (Dermatology)  Indicate any recent Medical Services you may have received from other than [facility] providers in the past year (date may be approximate).     Assessment:   This is a routine wellness examination for Shawnie Dapper.  Hearing/Vision screen No results found.  Dietary issues and exercise activities discussed:     Goals Addressed             This Visit's Progress    OTHER       GO HIKING WITH DAUGHTER       Depression Screen    09/03/2022    9:01 AM 07/25/2022   10:06 AM 02/28/2021   11:09 AM 03/26/2018    2:02 PM 09/04/2016    9:57 AM  PHQ 2/9 Scores  PHQ - 2 Score 0 0 0 0 0  PHQ- 9 Score 0  0      Fall Risk    09/03/2022    9:00 AM 07/25/2022   10:06 AM 02/28/2021   11:05 AM 09/04/2016    9:57 AM  Fall Risk   Falls in the past year?  0 0 No  Number falls in past yr: 0 0    Injury with Fall? 0 0    Risk for fall due to : No Fall Risks No Fall Risks    Follow up Falls evaluation completed Falls evaluation completed      MEDICARE RISK AT HOME:  Medicare Risk at Home - 09/03/22 0900     Any stairs in or around the home? Yes    If so, are there any without handrails? No    Home free of loose throw rugs in walkways, pet beds, electrical cords, etc? Yes    Adequate lighting in your home to reduce risk of falls? Yes    Life alert? No    Use of a cane, walker or w/c? No    Grab bars in the bathroom? No    Shower chair or bench in shower? No    Elevated toilet seat or a handicapped toilet? No             TIMED UP AND GO:  Was the test performed? No    Cognitive  Function:        09/03/2022    9:01 AM  6CIT Screen  What Year? 0 points  What month? 0 points  What time? 0 points  Count back from 20 0 points  Months in reverse 0 points  Repeat phrase 0 points  Total Score 0 points    Immunizations  There is no immunization history on file for this patient.  TDAP status: Patient wishes to receive this vaccine today after disclosing that insurance does not cover the vaccine as a preventative vaccine.   Flu Vaccine status: Declined, Education has been provided regarding the importance of this vaccine but patient still declined. Advised may receive this vaccine at local pharmacy or Health Dept. Aware to provide a copy of the vaccination record if obtained from local pharmacy or Health Dept. Verbalized acceptance and understanding.  Pneumococcal vaccine status: Declined,  Education has been provided regarding the importance of this vaccine but patient still declined. Advised may receive this vaccine at local pharmacy or Health Dept. Aware to provide a copy of the vaccination record if obtained from local pharmacy or Health Dept. Verbalized acceptance and understanding.   Covid-19 vaccine status: Information provided on how to obtain vaccines.   Qualifies for Shingles Vaccine? Yes   Zostavax completed No  Shingrix Completed?: No.    Education has been provided regarding the importance of this vaccine. Patient has been advised to call insurance company to determine out of pocket expense if they have not yet received this vaccine. Advised may also receive vaccine at local pharmacy or Health Dept. Verbalized acceptance and understanding.  Screening Tests Health Maintenance  Topic Date Due   COVID-19 Vaccine (1) Never done   DTaP/Tdap/Td (1 - Tdap) Never done   Zoster Vaccines- Shingrix (1 of 2) Never done   Pneumonia Vaccine 43+ Years old (1 of 1 - PCV) Never done   DEXA SCAN  Never done   INFLUENZA VACCINE  08/30/2022   Medicare Annual Wellness  (AWV)  09/03/2023   MAMMOGRAM  07/23/2024   Colonoscopy  11/23/2027   Hepatitis C Screening  Completed   HPV VACCINES  Aged Out    Health Maintenance  Health Maintenance Due  Topic Date Due   COVID-19 Vaccine (1) Never done   DTaP/Tdap/Td (1 - Tdap) Never done   Zoster Vaccines- Shingrix (1 of 2) Never done   Pneumonia Vaccine 2+ Years old (1 of 1 - PCV) Never done   DEXA SCAN  Never done   INFLUENZA VACCINE  08/30/2022    Colorectal cancer screening: Type of screening: Colonoscopy. Completed 11/22/2017. Repeat every 10 years  Mammogram status: Completed 07/24/2022. Repeat every year  Bone Density status: Completed 2023. Results reflect: Bone density results: NORMAL. Repeat every 2 years.  Lung Cancer Screening: (Low Dose CT Chest recommended if Age 73-80 years, 20 pack-year currently smoking OR have quit w/in 15years.) does qualify.   Lung Cancer Screening Referral: NA  Additional Screening:  Hepatitis C Screening: does qualify; Completed YES   Vision Screening: Recommended annual ophthalmology exams for early detection of glaucoma and other disorders of the eye. Is the patient up to date with their annual eye exam?  Yes  Who is the provider or what is the name of the office in which the patient attends annual eye exams? [facility]  If pt is not established with a provider, would they like to be referred to a provider to establish care?  ESTABLISHED .   Dental Screening: Recommended annual dental exams for proper oral hygiene  Diabetic Foot Exam:   Community Resource Referral / Chronic Care Management: CRR required this visit?  No   CCM required this visit?  No     Plan:     I have personally reviewed and noted the following in the patient's chart:   Medical and social history Use of alcohol, tobacco or illicit drugs  Current medications and supplements including opioid prescriptions. Patient is not currently taking opioid  prescriptions. Functional ability and status Nutritional status Physical activity Advanced directives List of other physicians Hospitalizations, surgeries, and ER visits in previous 12 months Vitals Screenings to include cognitive, depression, and falls Referrals and appointments  In addition, I have reviewed and discussed with patient certain preventive protocols, quality metrics, and best practice recommendations. A written personalized care plan for preventive services as well as general preventive health recommendations were provided to patient.     Delana Meyer   09/03/2022   After Visit Summary: (MyChart) Due to this being a telephonic visit, the after visit summary with patients personalized plan was offered to patient via MyChart   Nurse Notes: NONE   Ms. Aden , Thank you for taking time to come for your Medicare Wellness Visit. I appreciate your ongoing commitment to your health  goals. Please review the following plan we discussed and let me know if I can assist you in the future.   These are the goals we discussed:  Goals      OTHER     GO HIKING WITH DAUGHTER         This is a list of the screening recommended for you and due dates:  Health Maintenance  Topic Date Due   COVID-19 Vaccine (1) Never done   DTaP/Tdap/Td vaccine (1 - Tdap) Never done   Zoster (Shingles) Vaccine (1 of 2) Never done   Pneumonia Vaccine (1 of 1 - PCV) Never done   DEXA scan (bone density measurement)  Never done   Flu Shot  08/30/2022   Medicare Annual Wellness Visit  09/03/2023   Mammogram  07/23/2024   Colon Cancer Screening  11/23/2027   Hepatitis C Screening  Completed   HPV Vaccine  Aged Out

## 2022-09-03 NOTE — Patient Instructions (Signed)

## 2022-09-26 NOTE — Progress Notes (Signed)
Chief Complaint  Patient presents with   Annual Exam    Pt has concerns about heart murmur and R ear pain.     HPI: Patient  Bridget Elliott  67 y.o. comes in today for Preventive Health Care visit  And a fe concerns   June had uti  saw Dr B  he felt she had a soft heart murmur. No hx of same   Had ed visit for atypical  cp  pressure  and had evaluation   transient   not always  related  Atypical .  Like pressure . Not sure what it is   . Not exercise induced  Had transient burny like pain behind r ear area getting better no rash   Health Maintenance  Topic Date Due   DTaP/Tdap/Td (1 - Tdap) Never done   COVID-19 Vaccine (1) 10/13/2022 (Originally 07/28/1960)   DEXA SCAN  12/28/2022 (Originally 07/28/2021)   Zoster Vaccines- Shingrix (1 of 2) 12/28/2022 (Originally 07/29/1975)   INFLUENZA VACCINE  04/29/2023 (Originally 08/30/2022)   Pneumonia Vaccine 85+ Years old (1 of 1 - PCV) 09/27/2023 (Originally 07/28/2021)   Medicare Annual Wellness (AWV)  09/03/2023   MAMMOGRAM  07/23/2024   Colonoscopy  11/23/2027   Hepatitis C Screening  Completed   HPV VACCINES  Aged Out   Health Maintenance Review LIFESTYLE:  Exercise:  walks 2 miles per day and outside .  Tobacco/ETS: no Alcohol:  weekends  Sugar beverages:no Sleep: about 7  Drug use: no HH of   2 1 dog  Work:3 d per week .  And grandkids     ROS:  GEN/ HEENT: No fever, significant weight changes sweats headaches vision problems hearing changes, CV/ PULM; No  shortness of breath cough, syncope,edema  change in exercise tolerance. GI /GU: No adominal pain, vomiting, change in bowel habits. No blood in the stool. No significant GU symptoms. SKIN/HEME: ,no acute skin rashes suspicious lesions or bleeding. No lymphadenopathy, nodules, masses.  NEURO/ PSYCH:  No neurologic signs such as weakness numbness. No depression anxiety. IMM/ Allergy: No unusual infections.  Allergy .   REST of 12 system review negative except as  per HPI   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   Qualifier: History of  By: Genelle Gather CMA, [place]     Hx of varicella    Renal stone    hx of removal and lithotrypsy    Scarlet fever 03/21/2007   Qualifier: History of  By: Genelle Gather CMA, [place]     UTI (urinary tract infection) 10/22/2011    Past Surgical History:  Procedure Laterality Date   COLONOSCOPY  1841   kidney stone removal     left calf surgery     x2     TONSILLECTOMY      Family History  Problem Relation Age of Onset   Cancer Mother    Hypertension Mother    Hyperlipidemia Mother    Cancer Father        Lung,esophageal   Esophageal cancer Father    Heart attack Maternal Grandfather    Heart attack Paternal Grandfather    Colon cancer Neg Hx    Colon polyps Neg Hx    Rectal cancer Neg Hx    Stomach cancer Neg Hx    Pancreatic cancer Neg Hx     Social History   Socioeconomic History   Marital status: Married    Spouse name: Not on file   Number of  children: Not on file   Years of education: Not on file   Highest education level: Bachelor's degree (e.g., BA, AB, BS)  Occupational History   Not on file  Tobacco Use   Smoking status: Never   Smokeless tobacco: Never  Vaping Use   Vaping status: Never Used  Substance and Sexual Activity   Alcohol use: Yes    Alcohol/week: 0.0 standard drinks of alcohol    Comment: 5 glasses of wine a week   Drug use: No   Sexual activity: Yes    Partners: Male  Other Topics Concern   Not on file  Social History Narrative   7 hours of sleep per night   Does not work outside the home   Lives alone with her husband   2 Cats in the home   G4P4   LIFESTYLE:    Exercise:     Tobacco/ETS:no   Alcohol: per day  0 - 5 per week    Sugar beverages:   Sleep: 7 hours    Drug use: no   Social Determinants of Health   Financial Resource Strain: Low Risk  (09/03/2022)   Overall Financial Resource Strain (CARDIA)    Difficulty of Paying Living  Expenses: Not hard at all  Food Insecurity: No Food Insecurity (09/03/2022)   Hunger Vital Sign    Worried About Running Out of Food in the Last Year: Never true    Ran Out of Food in the Last Year: Never true  Transportation Needs: Unknown (09/03/2022)   PRAPARE - Administrator, Civil Service (Medical): Not on file    Lack of Transportation (Non-Medical): No  Physical Activity: Sufficiently Active (09/03/2022)   Exercise Vital Sign    Days of Exercise per Week: 7 days    Minutes of Exercise per Session: 40 min  Stress: No Stress Concern Present (09/03/2022)   [employer] of Occupational Health - Occupational Stress Questionnaire    Feeling of Stress : Not at all  Social Connections: Socially Integrated (09/03/2022)   Social Connection and Isolation Panel [NHANES]    Frequency of Communication with Friends and Family: More than three times a week    Frequency of Social Gatherings with Friends and Family: More than three times a week    Attends Religious Services: More than 4 times per year    Active Member of [employer] or Organizations: Yes    Attends Engineer, structural: More than 4 times per year    Marital Status: Married    Outpatient Medications Prior to Visit  Medication Sig Dispense Refill   CALCIUM CITRATE PO Take by mouth.     Elderberry 575 MG/5ML SYRP Take by mouth.     Multiple Vitamins-Minerals (EMERGEN-C IMMUNE) PACK Take by mouth daily.     Probiotic Product (PROBIOTIC DAILY PO) Take by mouth.     valACYclovir (VALTREX) 1000 MG tablet Take 2 tablets (2,000 mg total) by mouth 2 (two) times daily. As needed 30 tablet 5   cephALEXin (KEFLEX) 500 MG capsule Take 1 capsule (500 mg total) by mouth 3 (three) times daily. (Patient not taking: Reported on 09/03/2022) 21 capsule 0   cholecalciferol (VITAMIN D) 1000 units tablet Take 2,000 Units by mouth daily.     No facility-administered medications prior to visit.     EXAM:  BP 120/86 (BP Location: Left  Arm, Patient Position: Sitting, Cuff Size: Normal)   Pulse 85   Temp 98.2 F (36.8 C) (Oral)  Ht 5' 4.5" (1.638 m)   Wt 134 lb 9.6 oz (61.1 kg)   SpO2 97%   BMI 22.75 kg/m   Body mass index is 22.75 kg/m. Wt Readings from Last 3 Encounters:  09/27/22 134 lb 9.6 oz (61.1 kg)  07/25/22 137 lb 1.6 oz (62.2 kg)  06/12/21 141 lb 3.2 oz (64 kg)    Physical Exam: Vital signs reviewed FAO:ZHYQ is a well-developed well-nourished alert cooperative    who appearsr stated age in no acute distress.  HEENT: normocephalic atraumatic , Eyes: PERRL EOM's full, conjunctiva clear, Nares: paten,t no deformity discharge or tenderness., Ears: no deformity EAC's clear TMs with normal landmarks. Mouth: clear OP, no lesions, edema.  Moist mucous membranes. Dentition in adequate repair. NECK: supple without masses, thyromegaly or bruits. CHEST/PULM:  Clear to auscultation and percussion breath sounds equal no wheeze , rales or rhonchi. No chest wall deformities or tenderness. Breast: normal by inspection . No dimpling, discharge, masses, tenderness or discharge . CV: PMI is nondisplaced, S1 S2 no gallops, murmurs, supine r. When sitting  soft 2/6? Very short SEM usb r non radiating  Peripheral pulses are full without delay.No JVD .  ABDOMEN: Bowel sounds normal nontender  No guard or rebound, no hepato splenomegal no CVA tenderness.  No hernia. Extremtities:  No clubbing cyanosis or edema, no acute joint swelling or redness no focal atrophy NEURO:  Oriented x3, cranial nerves 3-12 appear to be intact, no obvious focal weakness,gait within normal limits no abnormal reflexes or asymmetrical SKIN: No acute rashes normal turgor, color, no bruising or petechiae. PSYCH: Oriented, good eye contact, no obvious depression anxiety, cognition and judgment appear normal. LN: no cervical axillary inguinal adenopathy  Lab Results  Component Value Date   WBC 6.6 09/27/2022   HGB 15.5 (H) 09/27/2022   HCT 47.4 (H)  09/27/2022   PLT 317.0 09/27/2022   GLUCOSE 81 09/27/2022   CHOL 233 (H) 09/27/2022   TRIG 84.0 09/27/2022   HDL 66.30 09/27/2022   LDLDIRECT 146.9 09/20/2009   LDLCALC 150 (H) 09/27/2022   ALT 20 09/27/2022   AST 21 09/27/2022   NA 140 09/27/2022   K 4.1 09/27/2022   CL 103 09/27/2022   CREATININE 0.76 09/27/2022   BUN 23 09/27/2022   CO2 30 09/27/2022   TSH 1.12 09/27/2022   HGBA1C 5.8 09/27/2022    BP Readings from Last 3 Encounters:  09/27/22 120/86  07/25/22 134/70  06/12/21 124/72    Labplan  reviewed with patient   ASSESSMENT AND PLAN:  Discussed the following assessment and plan:    ICD-10-CM   1. Visit for preventive health examination  Z00.00     2. Recurrent chest pain/discomfort  R07.9 CBC with Differential/Platelet    Comprehensive metabolic panel    Lipid panel    TSH    Hemoglobin A1c    ECHOCARDIOGRAM COMPLETE    Ambulatory referral to Cardiology   atypical see ed visit  uncertain cause disc  further assessment from cards    3. Elevated LDL cholesterol level  E78.00 CBC with Differential/Platelet    Comprehensive metabolic panel    Lipid panel    TSH    Hemoglobin A1c    4. Liver cyst  K76.89 CBC with Differential/Platelet    Comprehensive metabolic panel    Lipid panel    TSH    Hemoglobin A1c    US Abdomen Limited RUQ (LIVER/GB)   following will review record about follow over due for monitoring  5. Hyperlipidemia, unspecified hyperlipidemia type  E78.5 CBC with Differential/Platelet    Comprehensive metabolic panel    Lipid panel    TSH    Hemoglobin A1c    6. Aortic ejection murmur  I35.1 CBC with Differential/Platelet    Comprehensive metabolic panel    Lipid panel    TSH    Hemoglobin A1c    ECHOCARDIOGRAM COMPLETE    Ambulatory referral to Cardiology   very short i upright postition  not heard in supine.  plan  echo    May benefit from cardiac imagining Return for depending on results.  Patient Care Team: Carmilla Granville,  Neta Mends, MD as PCP - General (Internal Medicine) Richarda Overlie, MD as Consulting Physician (Obstetrics and Gynecology) [facility] (Ophthalmology) Elmon Else, MD as Consulting Physician (Dermatology) Patient Instructions  Exam is good  except very mild short Murmur   when sitting up may not be significant  .  But needs evaluated .  And for atypical chest pressure. Plan echocardiogram  Lab today  Cardiology referral consult .  But suspect not a cardiac cause.       Neta Mends. Taraann Olthoff M.D.  IMPRESSION: 1. There is a 3 mm polyp in the gallbladder, measured 4 mm previously. Polyps less than 6 mm do not require follow-up. 2. There is a hyperechoic mass in the left hepatic lobe measuring 13 x 13 x 15 mm today, not identified on the previous study. This mass is likely either focal fatty deposition or a hemangioma. Recommend a follow-up ultrasound in 3-6 months to ensure stability. 3. The second hyperechoic mass in the left hepatic lobe is unchanged, most likely focal fatty deposition or a hemangioma. Recommend attention on follow-up. 4. The complicated/septated cyst in the right hepatic lobe is unchanged. Recommend attention on follow-up.     Electronically Signed   By: Gerome Sam III M.D.   On: 06/27/2021 21:00

## 2022-09-27 ENCOUNTER — Encounter: Payer: Self-pay | Admitting: Internal Medicine

## 2022-09-27 ENCOUNTER — Ambulatory Visit (INDEPENDENT_AMBULATORY_CARE_PROVIDER_SITE_OTHER): Payer: Medicare Other | Admitting: Internal Medicine

## 2022-09-27 VITALS — BP 120/86 | HR 85 | Temp 98.2°F | Ht 64.5 in | Wt 134.6 lb

## 2022-09-27 DIAGNOSIS — R079 Chest pain, unspecified: Secondary | ICD-10-CM | POA: Diagnosis not present

## 2022-09-27 DIAGNOSIS — E7800 Pure hypercholesterolemia, unspecified: Secondary | ICD-10-CM | POA: Diagnosis not present

## 2022-09-27 DIAGNOSIS — K7689 Other specified diseases of liver: Secondary | ICD-10-CM

## 2022-09-27 DIAGNOSIS — I351 Nonrheumatic aortic (valve) insufficiency: Secondary | ICD-10-CM

## 2022-09-27 DIAGNOSIS — Z0000 Encounter for general adult medical examination without abnormal findings: Secondary | ICD-10-CM | POA: Diagnosis not present

## 2022-09-27 DIAGNOSIS — E785 Hyperlipidemia, unspecified: Secondary | ICD-10-CM | POA: Diagnosis not present

## 2022-09-27 LAB — COMPREHENSIVE METABOLIC PANEL
ALT: 20 U/L (ref 0–35)
AST: 21 U/L (ref 0–37)
Albumin: 4.8 g/dL (ref 3.5–5.2)
Alkaline Phosphatase: 79 U/L (ref 39–117)
BUN: 23 mg/dL (ref 6–23)
CO2: 30 mEq/L (ref 19–32)
Calcium: 10.2 mg/dL (ref 8.4–10.5)
Chloride: 103 mEq/L (ref 96–112)
Creatinine, Ser: 0.76 mg/dL (ref 0.40–1.20)
GFR: 81.8 mL/min (ref >60.00)
Glucose, Bld: 81 mg/dL (ref 70–99)
Potassium: 4.1 mEq/L (ref 3.5–5.1)
Sodium: 140 mEq/L (ref 135–145)
Total Bilirubin: 0.8 mg/dL (ref 0.2–1.2)
Total Protein: 7.7 g/dL (ref 6.0–8.3)

## 2022-09-27 LAB — CBC WITH DIFFERENTIAL/PLATELET
Basophils Absolute: 0.1 10*3/uL (ref 0.0–0.1)
Basophils Relative: 0.9 % (ref 0.0–3.0)
Eosinophils Absolute: 0.1 10*3/uL (ref 0.0–0.7)
Eosinophils Relative: 1.3 % (ref 0.0–5.0)
HCT: 47.4 % — ABNORMAL HIGH (ref 36.0–46.0)
Hemoglobin: 15.5 g/dL — ABNORMAL HIGH (ref 12.0–15.0)
Lymphocytes Relative: 39.1 % (ref 12.0–46.0)
Lymphs Abs: 2.6 10*3/uL (ref 0.7–4.0)
MCHC: 32.7 g/dL (ref 30.0–36.0)
MCV: 94.4 fl (ref 78.0–100.0)
Monocytes Absolute: 0.5 10*3/uL (ref 0.1–1.0)
Monocytes Relative: 8.1 % (ref 3.0–12.0)
Neutro Abs: 3.4 10*3/uL (ref 1.4–7.7)
Neutrophils Relative %: 50.6 % (ref 43.0–77.0)
Platelets: 317 10*3/uL (ref 150.0–400.0)
RBC: 5.02 Mil/uL (ref 3.87–5.11)
RDW: 13.7 % (ref 11.5–15.5)
WBC: 6.6 10*3/uL (ref 4.0–10.5)

## 2022-09-27 LAB — LIPID PANEL
Cholesterol: 233 mg/dL — ABNORMAL HIGH (ref 0–200)
HDL: 66.3 mg/dL (ref >39.00)
LDL Cholesterol: 150 mg/dL — ABNORMAL HIGH (ref 0–99)
NonHDL: 166.68
Total CHOL/HDL Ratio: 4
Triglycerides: 84 mg/dL (ref 0.0–149.0)
VLDL: 16.8 mg/dL (ref 0.0–40.0)

## 2022-09-27 LAB — HEMOGLOBIN A1C: Hgb A1c MFr Bld: 5.8 % (ref 4.6–6.5)

## 2022-09-27 LAB — TSH: TSH: 1.12 u[IU]/mL (ref 0.35–5.50)

## 2022-09-27 MED ORDER — VALACYCLOVIR HCL 1 G PO TABS: 2000.0000 mg | ORAL_TABLET | Freq: Two times a day (BID) | ORAL | 5 refills | Status: DC

## 2022-09-27 NOTE — Patient Instructions (Addendum)
Exam is good  except very mild short Murmur   when sitting up may not be significant  .  But needs evaluated .  And for atypical chest pressure. Plan echocardiogram  Lab today  Cardiology referral consult .  But suspect not a cardiac cause.

## 2022-09-28 NOTE — Progress Notes (Signed)
Cholesterol up from baseline . Hemoglobin borderline up as in past  but no dx with this . No DM and rest of labs nl  or in  range

## 2022-10-03 ENCOUNTER — Ambulatory Visit (HOSPITAL_BASED_OUTPATIENT_CLINIC_OR_DEPARTMENT_OTHER)
Admission: RE | Admit: 2022-10-03 | Discharge: 2022-10-03 | Disposition: A | Discharge status: Home / Self Care | Payer: Medicare Other | Source: Ambulatory Visit | Attending: Internal Medicine | Admitting: Internal Medicine

## 2022-10-03 DIAGNOSIS — K7689 Other specified diseases of liver: Secondary | ICD-10-CM | POA: Diagnosis not present

## 2022-10-05 NOTE — Progress Notes (Signed)
Cysts polyps masses stable  Please order another ultrasound for RUQ to be done in 6 mos     following liver lesions cysts

## 2022-10-08 ENCOUNTER — Other Ambulatory Visit (HOSPITAL_BASED_OUTPATIENT_CLINIC_OR_DEPARTMENT_OTHER): Payer: Self-pay

## 2022-10-08 DIAGNOSIS — K7689 Other specified diseases of liver: Secondary | ICD-10-CM

## 2022-10-22 ENCOUNTER — Ambulatory Visit (HOSPITAL_BASED_OUTPATIENT_CLINIC_OR_DEPARTMENT_OTHER): Payer: Medicare Other

## 2022-10-22 DIAGNOSIS — R011 Cardiac murmur, unspecified: Secondary | ICD-10-CM | POA: Diagnosis not present

## 2022-10-22 DIAGNOSIS — R079 Chest pain, unspecified: Secondary | ICD-10-CM

## 2022-10-22 DIAGNOSIS — I351 Nonrheumatic aortic (valve) insufficiency: Secondary | ICD-10-CM | POA: Diagnosis not present

## 2022-10-22 LAB — ECHOCARDIOGRAM COMPLETE
Area-P 1/2: 3.77 cm2
S' Lateral: 1.86 cm

## 2022-10-22 NOTE — Progress Notes (Signed)
Echo looks pretty good  valves are normal . Keep appt with cardiology as planned

## 2022-12-20 ENCOUNTER — Ambulatory Visit (HOSPITAL_BASED_OUTPATIENT_CLINIC_OR_DEPARTMENT_OTHER): Payer: Medicare Other | Admitting: Cardiology

## 2022-12-20 ENCOUNTER — Encounter (HOSPITAL_BASED_OUTPATIENT_CLINIC_OR_DEPARTMENT_OTHER): Payer: Self-pay | Admitting: Cardiology

## 2022-12-20 VITALS — BP 112/76 | HR 82 | Ht 64.5 in | Wt 140.0 lb

## 2022-12-20 DIAGNOSIS — R0789 Other chest pain: Secondary | ICD-10-CM

## 2022-12-20 DIAGNOSIS — Z7189 Other specified counseling: Secondary | ICD-10-CM

## 2022-12-20 DIAGNOSIS — Z8249 Family history of ischemic heart disease and other diseases of the circulatory system: Secondary | ICD-10-CM | POA: Diagnosis not present

## 2022-12-20 NOTE — Patient Instructions (Signed)
Medication Instructions:  Your physician recommends that you continue on your current medications as directed. Please refer to the Current Medication list given to you today.   Labwork: NONE  Testing/Procedures: CALCIUM SCORE - THIS WILL BE $99 OUT OF POCKET   Follow-Up: TO BE DETERMINED BASED ON CALCIUM SCORE

## 2022-12-20 NOTE — Progress Notes (Signed)
Cardiology Office Note:  .   Date:  12/20/2022  ID:  Bridget Elliott, DOB 07-01-1955, MRN [id number] PCP: Madelin Headings, MD  [facility] Providers Cardiologist:  Jodelle Red, MD {  History of Present Illness: .   Bridget Elliott is a 67 y.o. female without prior cardiac history. She was referred for chest discomfort.  Note from Dr. Fabian Sharp from 09/27/22 reviewed. Noted atypical chest pressure, referred to cardiology for further evaluation. Noted soft murmur, ordered echo, which was unremarkable.  Today: Chest pain: noted in early 2023, ER visit in 01/2021 reviewed. Has not had any symptoms in a long time. -Quality: mild pressure, in the center of her chest -Frequency: coming and going, not related to exertion/activity -Duration: a few minutes  -Associated symptoms: none -Aggravating/alleviating factors: when she changed taking her vitamins in the AM instead of in the evening, the symptoms went away. Also better with belching.  -Prior cardiac history: recent echo unremarkable -Prior ECG: NSR -Prior workup: recent echo reviewed, normal -Prior treatment: none -Alcohol: now more on the weekends/special occasions, about 2-3 glasses of wine at a time -Tobacco: never -Comorbidities: none -Exercise level: walks 1.5-2 miles daily without symptoms -Cardiac ROS: no shortness of breath, no PND, no orthopnea, no LE edema, no syncope -Family history:  mother had afib, heart failure, no history of MI (obesity, smoking); died of sepsis age 21. Pat Gpa, Mat Gpa both had MI later in life. 4 siblings, all healthy.  ROS: Denies chest pain, shortness of breath at rest or with normal exertion. No PND, orthopnea, LE edema or unexpected weight gain. No syncope or palpitations. ROS otherwise negative except as noted.   Studies Reviewed: Marland Kitchen    EKG:  EKG Interpretation Date/Time:  Thursday December 20 2022 13:50:18 EST Ventricular Rate:  82 PR Interval:  134 QRS  Duration:  82 QT Interval:  348 QTC Calculation: 406 R Axis:   54  Text Interpretation: Normal sinus rhythm Normal ECG Confirmed by Jodelle Red [phone]) on 12/20/2022 2:00:30 PM    Physical Exam:   VS:  BP 112/76   Pulse 82   Ht 5' 4.5" (1.638 m)   Wt 140 lb (63.5 kg)   SpO2 95%   BMI 23.66 kg/m    Wt Readings from Last 3 Encounters:  12/20/22 140 lb (63.5 kg)  09/27/22 134 lb 9.6 oz (61.1 kg)  07/25/22 137 lb 1.6 oz (62.2 kg)    GEN: Well nourished, well developed in no acute distress HEENT: Normal, moist mucous membranes NECK: No JVD CARDIAC: regular rhythm, normal S1 and S2, no rubs or gallops. No murmur. VASCULAR: Radial and DP pulses 2+ bilaterally. No carotid bruits RESPIRATORY:  Clear to auscultation without rales, wheezing or rhonchi  ABDOMEN: Soft, non-tender, non-distended MUSCULOSKELETAL:  Ambulates independently SKIN: Warm and dry, no edema NEUROLOGIC:  Alert and oriented x 3. No focal neuro deficits noted. PSYCHIATRIC:  Normal affect    ASSESSMENT AND PLAN: .    Atypical chest pain -has not recurred in a long time, atypical in nature -reviewed red flag warning signs that need immediate medical attention  Family history of heart disease -we reviewed the pros/cons of calcium scores. A cardiac CT scan for coronary calcium is a non-invasive way of obtaining information about the presence, location and extent of calcified plaque in the coronary arteries--the vessels that supply oxygen-containing blood to the heart muscle. Calcified plaque results when there is a build-up of fat and other substances under the inner layer  of the artery. This material can calcify which signals the presence of atherosclerosis, a disease of the vessel wall, also called coronary artery disease.  People with this disease have an increased risk for heart attacks. In addition, over time, progression of plaque build up (CAD) can narrow the arteries or even close off blood flow to the  heart. Because calcium is a marker of CAD, the amount of calcium detected on a cardiac CT scan is a helpful prognostic tool.  -we reviewed the charts together which show the relationship between calcium score and 15 year all cause mortality -after shared decision making, will proceed with coronary calcium score. They understand this is an out of pocket/self pay test currently costing $99. -if calcium score nonzero, we did preliminarily discuss statin and the data for benefit   CV risk counseling and prevention -recommend heart healthy/Mediterranean diet, with whole grains, fruits, vegetable, fish, lean meats, nuts, and olive oil. Limit salt. -recommend moderate walking, 3-5 times/week for 30-50 minutes each session. Aim for at least 150 minutes.week. Goal should be pace of 3 miles/hours, or walking 1.5 miles in 30 minutes -recommend avoidance of tobacco products. Avoid excess alcohol. -ASCVD risk score: The 10-year ASCVD risk score (Arnett DK, et al., 2019) is: 4.8%   Values used to calculate the score:     Age: 35 years     Sex: Female     Is Non-Hispanic African American: No     Diabetic: No     Tobacco smoker: No     Systolic Blood Pressure: 112 mmHg     Is BP treated: No     HDL Cholesterol: 66.3 mg/dL     Total Cholesterol: 233 mg/dL    Dispo: to be determined based on results of testing  Signed, Jodelle Red, MD   Jodelle Red, MD, PhD, [facility] [place]  [facility]  [place]  [facility] & Vascular at [facility] at [facility] [address], [address] [place], [place] [address] [phone]

## 2022-12-24 NOTE — Progress Notes (Signed)
ACUTE VISIT No chief complaint on file.  HPI: [name] is a 67 y.o. female with a PMHx significant for liver lesion, bells palsy, and TMJ syndrome, who is here today complaining of elbow and arm swelling.   HPI  Review of Systems See other pertinent positives and negatives in HPI.  Current Outpatient Medications on File Prior to Visit  Medication Sig Dispense Refill   CALCIUM CITRATE PO Take by mouth.     Elderberry 575 MG/5ML SYRP Take by mouth.     Multiple Vitamins-Minerals (EMERGEN-C IMMUNE) PACK Take by mouth daily.     Probiotic Product (PROBIOTIC DAILY PO) Take by mouth.     valACYclovir (VALTREX) 1000 MG tablet Take 2 tablets (2,000 mg total) by mouth 2 (two) times daily. As needed 30 tablet 5   No current facility-administered medications on file prior to visit.    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   Qualifier: History of  By: Bridget Elliott CMA, [place]     Hx of varicella    Renal stone    hx of removal and lithotrypsy    Scarlet fever 03/21/2007   Qualifier: History of  By: Bridget Elliott CMA, [place]     UTI (urinary tract infection) 10/22/2011   No Known Allergies  Social History   Socioeconomic History   Marital status: Married    Spouse name: Not on file   Number of children: Not on file   Years of education: Not on file   Highest education level: Bachelor's degree (e.g., BA, AB, BS)  Occupational History   Not on file  Tobacco Use   Smoking status: Never   Smokeless tobacco: Never  Vaping Use   Vaping status: Never Used  Substance and Sexual Activity   Alcohol use: Yes    Alcohol/week: 0.0 standard drinks of alcohol    Comment: 5 glasses of wine a week   Drug use: No   Sexual activity: Yes    Partners: Male  Other Topics Concern   Not on file  Social History Narrative   7 hours of sleep per night   Does not work outside the home   Lives alone with her husband   2 Cats in the home   G4P4   LIFESTYLE:    Exercise:      Tobacco/ETS:no   Alcohol: per day  0 - 5 per week    Sugar beverages:   Sleep: 7 hours    Drug use: no   Social Determinants of Health   Elliott Resource Strain: Low Risk  (09/03/2022)   Overall Elliott Resource Strain (CARDIA)    Difficulty of Paying Living Expenses: Not hard at all  Food Insecurity: No Food Insecurity (09/03/2022)   Hunger Vital Sign    Worried About Running Out of Food in the Last Year: Never true    Ran Out of Food in the Last Year: Never true  Transportation Needs: Unknown (09/03/2022)   PRAPARE - Administrator, Civil Service (Medical): Not on file    Lack of Transportation (Non-Medical): No  Physical Activity: Sufficiently Active (09/03/2022)   Exercise Vital Sign    Days of Exercise per Week: 7 days    Minutes of Exercise per Session: 40 min  Stress: No Stress Concern Present (09/03/2022)   Bridget Elliott of Occupational Health - Occupational Stress Questionnaire    Feeling of Stress : Not at all  Social Connections: Socially Integrated (09/03/2022)   Social  Connection and Isolation Panel [NHANES]    Frequency of Communication with Friends and Family: More than three times a week    Frequency of Social Gatherings with Friends and Family: More than three times a week    Attends Religious Services: More than 4 times per year    Active Member of Bridget Elliott or Organizations: Yes    Attends Engineer, structural: More than 4 times per year    Marital Status: Married    There were no vitals filed for this visit. There is no height or weight on file to calculate BMI.  Physical Exam  ASSESSMENT AND PLAN:  Ms. Raney was seen today for elbow and arm swelling.   There are no diagnoses linked to this encounter.  No follow-ups on file.  I, Bridget Elliott, acting as a scribe for Bridget Peragine [place], MD., have documented all relevant documentation on the behalf of Bridget Steinhilber [place], MD, as directed by  Bridget Skyles [place], MD while in the presence of Bridget Gaudin  [place], MD.   I, Bridget Elliott, have reviewed all documentation for this visit. The documentation on 12/24/22 for the exam, diagnosis, procedures, and orders are all accurate and complete.  Bridget Hallberg G. [place], MD  [facility]. [place] office.  Discharge Instructions   None

## 2022-12-25 ENCOUNTER — Ambulatory Visit (INDEPENDENT_AMBULATORY_CARE_PROVIDER_SITE_OTHER): Payer: Medicare Other

## 2022-12-25 ENCOUNTER — Ambulatory Visit (INDEPENDENT_AMBULATORY_CARE_PROVIDER_SITE_OTHER): Payer: Medicare Other | Admitting: Family Medicine

## 2022-12-25 ENCOUNTER — Encounter: Payer: Self-pay | Admitting: Family Medicine

## 2022-12-25 VITALS — BP 128/80 | HR 100 | Resp 16 | Ht 64.5 in | Wt 140.2 lb

## 2022-12-25 DIAGNOSIS — M25521 Pain in right elbow: Secondary | ICD-10-CM

## 2022-12-25 DIAGNOSIS — S42401D Unspecified fracture of lower end of right humerus, subsequent encounter for fracture with routine healing: Secondary | ICD-10-CM

## 2022-12-25 MED ORDER — CELECOXIB 100 MG PO CAPS: 100.0000 mg | ORAL_CAPSULE | Freq: Two times a day (BID) | ORAL | 0 refills | Status: AC

## 2023-01-07 ENCOUNTER — Encounter: Payer: Self-pay | Admitting: Family Medicine

## 2023-01-07 ENCOUNTER — Ambulatory Visit (INDEPENDENT_AMBULATORY_CARE_PROVIDER_SITE_OTHER): Payer: Medicare Other | Admitting: Family Medicine

## 2023-01-07 VITALS — BP 138/82 | HR 80 | Temp 97.7°F | Resp 20 | Ht 64.5 in | Wt 140.0 lb

## 2023-01-07 DIAGNOSIS — N3001 Acute cystitis with hematuria: Secondary | ICD-10-CM

## 2023-01-07 LAB — POCT URINALYSIS DIPSTICK
Bilirubin, UA: NEGATIVE
Blood, UA: POSITIVE
Glucose, UA: NEGATIVE
Ketones, UA: NEGATIVE
Nitrite, UA: NEGATIVE
Protein, UA: POSITIVE — AB
Spec Grav, UA: 1.025 (ref 1.010–1.025)
Urobilinogen, UA: NEGATIVE U/dL — AB
pH, UA: 6 (ref 5.0–8.0)

## 2023-01-07 MED ORDER — CEFDINIR 300 MG PO CAPS: 300.0000 mg | ORAL_CAPSULE | Freq: Two times a day (BID) | ORAL | 0 refills | Status: AC

## 2023-01-07 NOTE — Progress Notes (Signed)
Assessment & Plan:  1. Acute cystitis with hematuria Education provided on UTIs. Encouraged adequate hydration.  - POCT urinalysis dipstick - Urine Culture; Future - cefdinir (OMNICEF) 300 MG capsule; Take 1 capsule (300 mg total) by mouth 2 (two) times daily for 7 days.  Dispense: 14 capsule; Refill: 0  Results for orders placed or performed in visit on 01/07/23  POCT urinalysis dipstick  Result Value Ref Range   Color, UA amber    Clarity, UA cloudy    Glucose, UA Negative Negative   Bilirubin, UA neg    Ketones, UA neg    Spec Grav, UA 1.025 1.010 - 1.025   Blood, UA pos    pH, UA 6.0 5.0 - 8.0   Protein, UA Positive (A) Negative   Urobilinogen, UA negative (A) 0.2 or 1.0 E.U./dL   Nitrite, UA neg    Leukocytes, UA Large (3+) (A) Negative   Appearance     Odor      Follow up plan: Return if symptoms worsen or fail to improve.  Deliah Boston, MSN, APRN, FNP-C  Subjective:  HPI: Bridget Elliott is a 67 y.o. female presenting on 01/07/2023 for Urinary Tract Infection (burning and pressure - started 2 days ago )  Patient complains of dysuria and suprapubic pressure. She has had symptoms for 2 days.  Patient denies back pain and fever. Patient does have a history of UTI and kidney stones.  Patient does not have a history of pyelonephritis.     ROS: Negative unless specifically indicated above in HPI.   Relevant past medical history reviewed and updated as indicated.   Allergies and medications reviewed and updated.   Current Outpatient Medications:    CALCIUM CITRATE PO, Take by mouth., Disp: , Rfl:    Elderberry 575 MG/5ML SYRP, Take by mouth., Disp: , Rfl:    Multiple Vitamins-Minerals (EMERGEN-C IMMUNE) PACK, Take by mouth daily., Disp: , Rfl:    Probiotic Product (PROBIOTIC DAILY PO), Take by mouth., Disp: , Rfl:    valACYclovir (VALTREX) 1000 MG tablet, Take 2 tablets (2,000 mg total) by mouth 2 (two) times daily. As needed (Patient not taking: Reported on  01/07/2023), Disp: 30 tablet, Rfl: 5  No Known Allergies  Objective:   BP 138/82   Pulse 80   Temp 97.7 F (36.5 C)   Resp 20   Ht 5' 4.5" (1.638 m)   Wt 140 lb (63.5 kg)   BMI 23.66 kg/m    Physical Exam Vitals reviewed.  Constitutional:      General: She is not in acute distress.    Appearance: Normal appearance. She is not ill-appearing, toxic-appearing or diaphoretic.  HENT:     Head: Normocephalic and atraumatic.  Eyes:     General: No scleral icterus.       Right eye: No discharge.        Left eye: No discharge.     Conjunctiva/sclera: Conjunctivae normal.  Cardiovascular:     Rate and Rhythm: Normal rate.  Pulmonary:     Effort: Pulmonary effort is normal. No respiratory distress.  Abdominal:     Tenderness: There is no right CVA tenderness or left CVA tenderness.  Musculoskeletal:        General: Normal range of motion.     Cervical back: Normal range of motion.  Skin:    General: Skin is warm and dry.     Capillary Refill: Capillary refill takes less than 2 seconds.  Neurological:  General: No focal deficit present.     Mental Status: She is alert and oriented to person, place, and time. Mental status is at baseline.  Psychiatric:        Mood and Affect: Mood normal.        Behavior: Behavior normal.        Thought Content: Thought content normal.        Judgment: Judgment normal.

## 2023-01-09 LAB — URINE CULTURE

## 2023-01-15 ENCOUNTER — Other Ambulatory Visit: Payer: Medicare Other

## 2023-01-15 ENCOUNTER — Ambulatory Visit: Payer: Medicare Other

## 2023-01-15 DIAGNOSIS — S42401D Unspecified fracture of lower end of right humerus, subsequent encounter for fracture with routine healing: Secondary | ICD-10-CM | POA: Diagnosis not present

## 2023-01-15 DIAGNOSIS — M25521 Pain in right elbow: Secondary | ICD-10-CM

## 2023-01-18 ENCOUNTER — Telehealth: Payer: Self-pay

## 2023-01-18 NOTE — Telephone Encounter (Signed)
Spoke to pt.   Pt reports pt reports feeling pressure and burning when urinates. having urinary frequently. sx started 12/19. took home uti test. positive for leuko and nitrate.   Inform pt she would like need an office visit for Rx to be sent. Offer to schedule an appt for Monday or can go to urgent care. Pt decided to schedule for Monday. Attempt to schedule an appt with Dr. Caryl Never, unable to due to restricted for same day reserved. Pt is okay to be seen on Tuesday 12/24 at 8:30am with Dr. Caryl Never.

## 2023-01-18 NOTE — Telephone Encounter (Signed)
Copied from CRM [id number]. Topic: Clinical - Medication Question >> Jan 18, 2023  2:27 PM Colletta Maryland S wrote: Reason for CRM: Pt states at home uti test is positive, pt is inquiring on rx being sent to pharmacy, pt can be contacted through [employer]

## 2023-01-22 ENCOUNTER — Encounter: Payer: Self-pay | Admitting: Family Medicine

## 2023-01-22 ENCOUNTER — Ambulatory Visit: Payer: Medicare Other | Admitting: Family Medicine

## 2023-01-22 VITALS — BP 124/74 | HR 110 | Temp 98.1°F | Wt 139.8 lb

## 2023-01-22 DIAGNOSIS — R300 Dysuria: Secondary | ICD-10-CM

## 2023-01-22 DIAGNOSIS — N3090 Cystitis, unspecified without hematuria: Secondary | ICD-10-CM

## 2023-01-22 LAB — POC URINALSYSI DIPSTICK (AUTOMATED)
Bilirubin, UA: NEGATIVE
Blood, UA: POSITIVE
Glucose, UA: NEGATIVE
Ketones, UA: NEGATIVE
Nitrite, UA: NEGATIVE
Protein, UA: POSITIVE — AB
Spec Grav, UA: 1.02 (ref 1.010–1.025)
Urobilinogen, UA: 0.2 U/dL
pH, UA: 6 (ref 5.0–8.0)

## 2023-01-22 MED ORDER — NITROFURANTOIN MONOHYD MACRO 100 MG PO CAPS: 100.0000 mg | ORAL_CAPSULE | Freq: Two times a day (BID) | ORAL | 0 refills | Status: DC

## 2023-01-22 NOTE — Progress Notes (Signed)
Established Patient Office Visit  Subjective   Patient ID: Bridget Elliott, female    DOB: April 18, 1955  Age: 67 y.o. MRN: [id number]  Chief Complaint  Patient presents with   Dysuria    HPI   Bridget Elliott is seen as a work and with few day history of some frequency with urination burning with urination.  Had fever last night 101 but none today.  No nausea or vomiting.  No flank pain.  No abdominal pain.  She had very similar symptoms back in early December and positive culture at that time for E. coli.  Was treated for 7 days with [facility] and symptoms fully resolved.  She did home urine screen yesterday which was positive for leukocytes and nitrites.  No known drug allergies.  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 She has never used smokeless tobacco. She reports current alcohol use. She reports that she does not use drugs. family history includes Cancer in her father and mother; Esophageal cancer in her father; Heart attack in her maternal grandfather and paternal grandfather; Hyperlipidemia in her mother; Hypertension in her mother. No Known Allergies  Review of Systems  Constitutional:  Negative for chills.  Gastrointestinal:  Negative for abdominal pain, nausea and vomiting.  Genitourinary:  Positive for dysuria and frequency. Negative for flank pain and hematuria.      Objective:     BP 124/74 (BP Location: Left Arm, Patient Position: Sitting, Cuff Size: Normal)   Pulse (!) 110   Temp 98.1 F (36.7 C) (Oral)   Wt 139 lb 12.8 oz (63.4  kg)   SpO2 96%   BMI 23.63 kg/m  BP Readings from Last 3 Encounters:  01/22/23 124/74  01/07/23 138/82  12/25/22 128/80   Wt Readings from Last 3 Encounters:  01/22/23 139 lb 12.8 oz (63.4 kg)  01/07/23 140 lb (63.5 kg)  12/25/22 140 lb 4 oz (63.6 kg)      Physical Exam Vitals reviewed.  Constitutional:      General: She is not in acute distress.    Appearance: She is not ill-appearing.  Cardiovascular:     Rate and Rhythm: Normal rate and regular rhythm.  Pulmonary:     Effort: Pulmonary effort is normal.     Breath sounds: Normal breath sounds.  Neurological:     Mental Status: She is alert.      Results for orders placed or performed in visit on 01/22/23  POCT Urinalysis Dipstick (Automated)  Result Value Ref Range   Color, UA Yellow    Clarity, UA Cloudy    Glucose, UA Negative Negative   Bilirubin, UA Negative    Ketones, UA Negative    Spec Grav, UA 1.020 1.010 - 1.025   Blood, UA Positive    pH, UA 6.0 5.0 - 8.0   Protein, UA Positive (A) Negative   Urobilinogen, UA 0.2 0.2  or 1.0 E.U./dL   Nitrite, UA Negative    Leukocytes, UA Large (3+) (A) Negative      The 10-year ASCVD risk score (Arnett DK, et al., 2019) is: 5.9%    Assessment & Plan:   Problem List Items Addressed This Visit   None Visit Diagnoses       Dysuria    -  Primary   Relevant Orders   POCT Urinalysis Dipstick (Automated) (Completed)     Urine dipstick today shows positive blood and 3+ leukocytes.  Negative nitrites.  Urine culture sent.  Start Macrobid 1 twice daily for 7 days.  Plenty of fluids.  Follow-up promptly for any recurrent fever or worsening symptoms.  No follow-ups on file.    Evelena Peat, MD

## 2023-01-24 LAB — URINE CULTURE
MICRO NUMBER:: 15888287
SPECIMEN QUALITY:: ADEQUATE

## 2023-02-05 ENCOUNTER — Telehealth (INDEPENDENT_AMBULATORY_CARE_PROVIDER_SITE_OTHER): Payer: Medicare Other | Admitting: Internal Medicine

## 2023-02-05 ENCOUNTER — Encounter: Payer: Self-pay | Admitting: Internal Medicine

## 2023-02-05 VITALS — Ht 64.5 in | Wt 139.0 lb

## 2023-02-05 DIAGNOSIS — R3915 Urgency of urination: Secondary | ICD-10-CM | POA: Diagnosis not present

## 2023-02-05 DIAGNOSIS — Z87440 Personal history of urinary (tract) infections: Secondary | ICD-10-CM

## 2023-02-05 DIAGNOSIS — M25521 Pain in right elbow: Secondary | ICD-10-CM

## 2023-02-05 DIAGNOSIS — R9389 Abnormal findings on diagnostic imaging of other specified body structures: Secondary | ICD-10-CM | POA: Diagnosis not present

## 2023-02-05 DIAGNOSIS — R195 Other fecal abnormalities: Secondary | ICD-10-CM

## 2023-02-05 DIAGNOSIS — M25421 Effusion, right elbow: Secondary | ICD-10-CM

## 2023-02-05 NOTE — Progress Notes (Signed)
 Virtual Visit via Video Note  I connected with Bridget Elliott on 02/05/23 at  4:00 PM EST by a video enabled telemedicine application and verified that I am speaking with the correct person using two identifiers. Location patient: home Location provider:work office Persons participating in the virtual visit: patient, provider   Patient aware  of the limitations of evaluation and management by telemedicine and  availability of in person appointments. and agreed to proceed.   HPI: Bridget Elliott presents for video visit Fu and new concern  R elbow seen Dr JINNY  nov 24 ordered x ray and celebrex   consider referral if not better ? St dx  Rx for cystitis x 2 ;12 9   rx omnicef  and 12 24  macrobid   e cloi pansesnitive both cultures  Last PV 8 24   Chief Complaint  Patient presents with   Follow-up    Pt reports she have had bowel movement, notice white mucus in stool. First noticed it in December. Still having white mucus in stool. Also following up on elbow issue. Had an X-ray with Dr. Jordan. Would like to further discuss with provider about treatment plan.     Dysuria     Had UTI back in Dec. 9, went to see [facility]. Treat with antibiotic. Sx started again on 12/21. Seen with Dr. Micheal on 12/24 treat with Macrobid . Pt reports discomfort when urinates on Friday, Jan. 3. More of pressure like.     Elbow is much bettoer but still sore  and no fever  no discrete injury but earlier in the day she had rough tug of war play with her 100# dog.  . No fever redness or fall but did have  inability pain with rom extension.  Is better but nor resolved had 2 x rays one suggesting a humeral fracture and another saying not but with effusion noted.   Has had   recent. Uti e coli  as above  Not sure but is  having some urgency recently  Stool  w white mucous recent  pre dates utis and meds  no change in bowel habits  blood abd pain .  Utd on colon screen  concern  significant of finding   .    ROS: See pertinent positives and negatives per HPI.  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   Qualifier: History of  By: Nickola CMA, [place]     Hx of varicella    Renal stone    hx of removal and lithotrypsy    Scarlet fever 03/21/2007   Qualifier: History of  By: Nickola CMA, [place]     UTI (urinary tract infection) 10/22/2011    Past Surgical History:  Procedure Laterality Date   COLONOSCOPY  1841   kidney stone removal     left calf surgery     x2     TONSILLECTOMY      Family History  Problem Relation Age of Onset   Cancer Mother    Hypertension Mother    Hyperlipidemia Mother    Cancer Father        Lung,esophageal   Esophageal cancer Father    Heart attack Maternal Grandfather    Heart attack Paternal Grandfather    Colon cancer Neg Hx    Colon polyps Neg Hx    Rectal cancer Neg Hx    Stomach cancer Neg Hx    Pancreatic cancer Neg Hx     Social History  Tobacco Use   Smoking status: Never   Smokeless tobacco: Never  Vaping Use   Vaping status: Never Used  Substance Use Topics   Alcohol use: Yes    Alcohol/week: 0.0 standard drinks of alcohol    Comment: 5 glasses of wine a week   Drug use: No      Current Outpatient Medications:    CALCIUM  CITRATE PO, Take by mouth., Disp: , Rfl:    Elderberry 575 MG/5ML SYRP, Take by mouth., Disp: , Rfl:    Multiple Vitamins-Minerals (EMERGEN-C IMMUNE) PACK, Take by mouth daily., Disp: , Rfl:    Probiotic Product (PROBIOTIC DAILY PO), Take by mouth., Disp: , Rfl:    nitrofurantoin , macrocrystal-monohydrate, (MACROBID ) 100 MG capsule, Take 1 capsule (100 mg total) by mouth 2 (two) times daily. (Patient not taking: Reported on 02/05/2023), Disp: 14 capsule, Rfl: 0   valACYclovir  (VALTREX ) 1000 MG tablet, Take 2 tablets (2,000 mg total) by mouth 2 (two) times daily. As needed (Patient not taking: Reported on 02/05/2023), Disp: 30 tablet, Rfl: 5  EXAM: BP Readings from Last 3 Encounters:   01/22/23 124/74  01/07/23 138/82  12/25/22 128/80    VITALS per patient if applicable:  GENERAL: alert, oriented, appears well and in no acute distres LUNGS: on inspection no signs of respiratory distress, breathing rate appears normal, no obvious gross SOB, gasping or wheezing  PSYCH/NEURO: pleasant and cooperative, no obvious depression or anxiety, speech and thought processing grossly intact Lab Results  Component Value Date   WBC 6.6 09/27/2022   HGB 15.5 (H) 09/27/2022   HCT 47.4 (H) 09/27/2022   PLT 317.0 09/27/2022   GLUCOSE 81 09/27/2022   CHOL 233 (H) 09/27/2022   TRIG 84.0 09/27/2022   HDL 66.30 09/27/2022   LDLDIRECT 146.9 09/20/2009   LDLCALC 150 (H) 09/27/2022   ALT 20 09/27/2022   AST 21 09/27/2022   NA 140 09/27/2022   K 4.1 09/27/2022   CL 103 09/27/2022   CREATININE 0.76 09/27/2022   BUN 23 09/27/2022   CO2 30 09/27/2022   TSH 1.12 09/27/2022   HGBA1C 5.8 09/27/2022  X ray review  from December  and uti ua and cx   ASSESSMENT AND PLAN:  Discussed the following assessment and plan:    ICD-10-CM   1. Right elbow pain  M25.521 Ambulatory referral to Orthopedic Surgery   with swelling and  abnormal x ray  no specific trauma    2. Urinary urgency  R39.15 Urinalysis, Routine w reflex microscopic    3. Hx: UTI (urinary tract infection)  Z87.440 Urinalysis, Routine w reflex microscopic    4. Abnormal x-ray  R93.89 Ambulatory referral to Orthopedic Surgery    5. Elbow effusion, right  M25.421 Ambulatory referral to Orthopedic Surgery    6. Change in stool white mucous  R19.5      Uncertain curious dx of elbow pain and effusion on x ray  getting better but no resolved and no explanation for difficulties . Consider further imaging but best via ortho .  Referral placed   Uncertain  cause of stool mucous but may be a benign process   . Will follow and address if  persistent or progressive and or  other progressive sx .   Repeat ua with micro. [facility] lab  order   Counseled.   Expectant management and discussion of plan and treatment with opportunity to ask questions and all were answered. The patient agreed with the plan and demonstrated an understanding of the  instructions.   Advised to call back or seek an in-person evaluation if worsening  or having  further concerns  in interim. Return if symptoms worsen or fail to improve, for depending on results.    Apolinar Eastern, MD

## 2023-02-06 DIAGNOSIS — K080 Exfoliation of teeth due to systemic causes: Secondary | ICD-10-CM | POA: Diagnosis not present

## 2023-02-08 ENCOUNTER — Other Ambulatory Visit (INDEPENDENT_AMBULATORY_CARE_PROVIDER_SITE_OTHER): Payer: Medicare Other

## 2023-02-08 ENCOUNTER — Ambulatory Visit (HOSPITAL_BASED_OUTPATIENT_CLINIC_OR_DEPARTMENT_OTHER)
Admission: RE | Admit: 2023-02-08 | Discharge: 2023-02-08 | Disposition: A | Discharge status: Home / Self Care | Payer: Self-pay | Source: Ambulatory Visit | Attending: Cardiology | Admitting: Cardiology

## 2023-02-08 DIAGNOSIS — R3915 Urgency of urination: Secondary | ICD-10-CM

## 2023-02-08 DIAGNOSIS — Z87440 Personal history of urinary (tract) infections: Secondary | ICD-10-CM

## 2023-02-08 DIAGNOSIS — Z8249 Family history of ischemic heart disease and other diseases of the circulatory system: Secondary | ICD-10-CM | POA: Insufficient documentation

## 2023-02-08 LAB — URINALYSIS, ROUTINE W REFLEX MICROSCOPIC
Bilirubin Urine: NEGATIVE
Hgb urine dipstick: NEGATIVE
Ketones, ur: NEGATIVE
Nitrite: NEGATIVE
Specific Gravity, Urine: 1.015 (ref 1.000–1.030)
Total Protein, Urine: NEGATIVE
Urine Glucose: NEGATIVE
Urobilinogen, UA: 0.2 (ref 0.0–1.0)
pH: 6.5 (ref 5.0–8.0)

## 2023-02-10 ENCOUNTER — Encounter: Payer: Self-pay | Admitting: Internal Medicine

## 2023-02-10 NOTE — Progress Notes (Signed)
 Urine shows a few bacteria  and WBC but no other significant finding .  If you are not having any symptoms of concern   No intervention  needed But if you  feel you are getting another UTI then we can send in another  course of macrobid   100 1 po bid for 10 days ( longer  treatment )  disp 20 .

## 2023-02-11 ENCOUNTER — Other Ambulatory Visit: Payer: Self-pay | Admitting: Family

## 2023-02-11 ENCOUNTER — Encounter: Payer: Self-pay | Admitting: Orthopedic Surgery

## 2023-02-11 ENCOUNTER — Ambulatory Visit (INDEPENDENT_AMBULATORY_CARE_PROVIDER_SITE_OTHER): Payer: Medicare Other | Admitting: Orthopedic Surgery

## 2023-02-11 DIAGNOSIS — M25521 Pain in right elbow: Secondary | ICD-10-CM | POA: Diagnosis not present

## 2023-02-11 MED ORDER — NITROFURANTOIN MONOHYD MACRO 100 MG PO CAPS: 100.0000 mg | ORAL_CAPSULE | Freq: Two times a day (BID) | ORAL | 0 refills | Status: DC

## 2023-02-11 NOTE — Progress Notes (Signed)
 Office Visit Note   Patient: Bridget Elliott           Date of Birth: 08/10/1955           MRN: [id number] Visit Date: 02/11/2023 Requested by: Charlett Apolinar POUR, MD [address] [place],  [place] [address] PCP: Charlett Apolinar POUR, MD  Subjective: Chief Complaint  Patient presents with   Right Elbow - Pain    HPI: Symone Cornman is a 68 y.o. female who presents to the office reporting right elbow pain of 1 month duration.  She was playing with a dog but did not have any discrete injury.  5 AM that day she woke up with pain and swelling localizing to the elbow and about a handbreadth proximal and distal to the elbow flexion crease.  That resolved after a week and she was taking Advil during that time.  She still has some residual loss of full extension.  Not currently taking any medicine.  For that problem.  No family history of gout or pseudogout.  Overall she is about 75% better.  She is right-hand dominant.  Denies any numbness and tingling in that right arm and no neck pain..                ROS: All systems reviewed are negative as they relate to the chief complaint within the history of present illness.  Patient denies fevers or chills.  Assessment & Plan: Visit Diagnoses:  1. Pain in right elbow     Plan: Impression is relatively acute onset of right elbow pain with anterior fat pad sign present on radiographs at the time of her presentation.  Based on its response to anti-inflammatories I think this could represent gout or pseudogout.  Laboratory values drawn today for uric acid CBC DIF sed rate C-reactive protein rheumatoid factor and ANA.  Could also be initial presentation of a spondyloarthropathy but I think that is less likely.  I will call her with those results.  No indication for further workup at this time.  No definite structural problem with the tendons or stabilizing ligaments around the elbow.  Follow-Up Instructions: No follow-ups on file.   Orders:   Orders Placed This Encounter  Procedures   Uric acid   Sed Rate (ESR)   CBC with Differential   CRP High sensitivity   Rheumatoid Factor   Antinuclear Antib (ANA)   No orders of the defined types were placed in this encounter.     Procedures: No procedures performed   Clinical Data: No additional findings.  Objective: Vital Signs: There were no vitals taken for this visit.  Physical Exam:  Constitutional: Patient appears well-developed HEENT:  Head: Normocephalic Eyes:EOM are normal Neck: Normal range of motion Cardiovascular: Normal rate Pulmonary/chest: Effort normal Neurologic: Patient is alert Skin: Skin is warm Psychiatric: Patient has normal mood and affect  Ortho Exam: Ortho exam demonstrates lack of about 5 degrees of full extension on the right with full extension of the left present.  Full flexion present bilaterally.  Pronation supination strength is intact and range of motion with pronation supination also symmetric with the left-hand side.  No biceps triceps tenderness.  No medial or lateral epicondyle tenderness.  No definite effusion in the right elbow versus left.  No proximal lymphadenopathy.  No warmth to the elbow or other joints.  Specialty Comments:  No specialty comments available.  Imaging: No results found.   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   Qualifier: History of  By: Nickola CMA, [place]     Hx of varicella    Renal stone    hx of removal and lithotrypsy    Scarlet fever 03/21/2007   Qualifier: History of  By: Nickola CMA, [place]     UTI (urinary tract infection) 10/22/2011    Family History  Problem Relation Age of Onset   Cancer Mother    Hypertension Mother     Hyperlipidemia Mother    Cancer Father        Lung,esophageal   Esophageal cancer Father    Heart attack Maternal Grandfather    Heart attack Paternal Grandfather    Colon cancer Neg Hx    Colon polyps Neg Hx    Rectal cancer Neg Hx    Stomach cancer Neg Hx    Pancreatic cancer Neg Hx     Past Surgical History:  Procedure Laterality Date   COLONOSCOPY  1841   kidney stone removal     left calf surgery     x2     TONSILLECTOMY     Social History   Occupational History   Not on file  Tobacco Use   Smoking status: Never   Smokeless tobacco: Never  Vaping Use   Vaping status: Never Used  Substance and Sexual Activity   Alcohol use: Yes    Alcohol/week: 0.0 standard drinks of alcohol    Comment: 5 glasses of wine a week   Drug use: No   Sexual activity: Yes    Partners: Male

## 2023-02-13 LAB — CBC WITH DIFFERENTIAL/PLATELET
Absolute Lymphocytes: 2125 {cells}/uL (ref 850–3900)
Absolute Monocytes: 554 {cells}/uL (ref 200–950)
Basophils Absolute: 53 {cells}/uL (ref 0–200)
Basophils Relative: 0.8 %
Eosinophils Absolute: 40 {cells}/uL (ref 15–500)
Eosinophils Relative: 0.6 %
HCT: 45.3 % — ABNORMAL HIGH (ref 35.0–45.0)
Hemoglobin: 15 g/dL (ref 11.7–15.5)
MCH: 30.5 pg (ref 27.0–33.0)
MCHC: 33.1 g/dL (ref 32.0–36.0)
MCV: 92.3 fL (ref 80.0–100.0)
MPV: 10.9 fL (ref 7.5–12.5)
Monocytes Relative: 8.4 %
Neutro Abs: 3828 {cells}/uL (ref 1500–7800)
Neutrophils Relative %: 58 %
Platelets: 361 10*3/uL (ref 140–400)
RBC: 4.91 10*6/uL (ref 3.80–5.10)
RDW: 12.5 % (ref 11.0–15.0)
Total Lymphocyte: 32.2 %
WBC: 6.6 10*3/uL (ref 3.8–10.8)

## 2023-02-13 LAB — URIC ACID: Uric Acid, Serum: 4.2 mg/dL (ref 2.5–7.0)

## 2023-02-13 LAB — HIGH SENSITIVITY CRP: hs-CRP: 1.3 mg/L

## 2023-02-13 LAB — ANA: Anti Nuclear Antibody (ANA): NEGATIVE

## 2023-02-13 LAB — RHEUMATOID FACTOR: Rheumatoid fact SerPl-aCnc: <10 [IU]/mL (ref <14)

## 2023-02-13 LAB — SEDIMENTATION RATE: Sed Rate: 2 mm/h (ref 0–30)

## 2023-02-18 ENCOUNTER — Encounter (HOSPITAL_BASED_OUTPATIENT_CLINIC_OR_DEPARTMENT_OTHER): Payer: Self-pay

## 2023-02-18 NOTE — Progress Notes (Signed)
I called elbow ok no f/u

## 2023-02-19 ENCOUNTER — Telehealth (HOSPITAL_BASED_OUTPATIENT_CLINIC_OR_DEPARTMENT_OTHER): Payer: Self-pay

## 2023-02-19 DIAGNOSIS — E785 Hyperlipidemia, unspecified: Secondary | ICD-10-CM

## 2023-02-19 MED ORDER — ROSUVASTATIN CALCIUM 10 MG PO TABS: 10.0000 mg | ORAL_TABLET | Freq: Every day | ORAL | 1 refills | Status: DC

## 2023-02-19 NOTE — Telephone Encounter (Signed)
-----   Message from Alver Sorrow sent at 02/18/2023  7:40 PM EST ----- Calcium score of 60.8 indicating mild coronary artery disease. Unlikely this is significant enough to cause any symptoms. However, important to pursue secondary prevention to prevent it from worsening. Recommend start Rosuvastatin 10mg  daily with FLP/LFT in 2-3 months and cardiology follow up visit in 6 months with Dr. Cristal Deer

## 2023-02-25 ENCOUNTER — Ambulatory Visit: Payer: Self-pay | Admitting: Internal Medicine

## 2023-02-25 ENCOUNTER — Ambulatory Visit (INDEPENDENT_AMBULATORY_CARE_PROVIDER_SITE_OTHER): Payer: Medicare Other | Admitting: Family Medicine

## 2023-02-25 ENCOUNTER — Encounter: Payer: Self-pay | Admitting: Family Medicine

## 2023-02-25 VITALS — BP 130/84 | HR 71 | Temp 97.7°F | Ht 64.5 in | Wt 141.8 lb

## 2023-02-25 DIAGNOSIS — R300 Dysuria: Secondary | ICD-10-CM

## 2023-02-25 DIAGNOSIS — N390 Urinary tract infection, site not specified: Secondary | ICD-10-CM

## 2023-02-25 DIAGNOSIS — Z87442 Personal history of urinary calculi: Secondary | ICD-10-CM

## 2023-02-25 DIAGNOSIS — R3989 Other symptoms and signs involving the genitourinary system: Secondary | ICD-10-CM | POA: Diagnosis not present

## 2023-02-25 DIAGNOSIS — R3915 Urgency of urination: Secondary | ICD-10-CM

## 2023-02-25 LAB — POC URINALSYSI DIPSTICK (AUTOMATED)
Bilirubin, UA: NEGATIVE
Blood, UA: POSITIVE
Glucose, UA: NEGATIVE
Ketones, UA: NEGATIVE
Nitrite, UA: NEGATIVE
Protein, UA: POSITIVE — AB
Spec Grav, UA: 1.02 (ref 1.010–1.025)
Urobilinogen, UA: 0.2 U/dL
pH, UA: 6.5 (ref 5.0–8.0)

## 2023-02-25 MED ORDER — SULFAMETHOXAZOLE-TRIMETHOPRIM 800-160 MG PO TABS: 1.0000 | ORAL_TABLET | Freq: Two times a day (BID) | ORAL | 0 refills | Status: AC

## 2023-02-25 NOTE — Telephone Encounter (Signed)
Copied from CRM [id number]. Topic: Clinical - Red Word Triage >> Feb 25, 2023 10:35 AM Corin V wrote: [facility] that prompted transfer to Nurse Triage: Patient just finished a round of antibiotics for a UTI and she woke up today with burning, pressure, pain when urinating. Pain is 5/10.  Chief Complaint: UTI Symptoms: Urinary frequency, painful urination, pressure when urinating Frequency: This morning Pertinent Negatives: Patient denies fever Disposition: [] ED /[] Urgent Care (no appt availability in office) / [x] Appointment(In office/virtual)/ []  [place] [facility]/ [] Home Care/ [] Refused Recommended Disposition /[] [facility]/ []  Follow-up with PCP Additional Notes: Patient called in to report symptoms of a UTI. Patient complained of increased urinary frequency, painful urination, and pressure when urination. Patient denied fever and flank pain. Patient stated that she finished taking an antibiotic for a previous UTI on Thursday of last week. Patient stated that UTI symptoms had gone away at that time. Advised patient to see a provider within 24 hours. No availability with PCP. Scheduled patient an appointment in the office with a different provider for today.   Reason for Disposition  Urinating more frequently than usual (i.e., frequency)  Answer Assessment - Initial Assessment Questions 1. SYMPTOM: "What's the main symptom you're concerned about?" (e.g., frequency, incontinence)     Urinary frequency, painful urination, and pressure when urinating  2. ONSET: "When did the urinary symptoms start?"     This morning  3. PAIN: "Is there any pain?" If Yes, ask: "How bad is it?" (Scale: 1-10; mild, moderate, severe)     Pain is a 5, discomfort is an 8  4. CAUSE: "What do you think is causing the symptoms?"     UTI  5. OTHER SYMPTOMS: "Do you have any other symptoms?" (e.g., blood in urine, fever, flank pain, pain with urination)     Denies blood in urine, denies flank  pain  Protocols used: Urinary Symptoms-A-AH

## 2023-02-25 NOTE — Progress Notes (Signed)
Established Patient Office Visit   Subjective  Patient ID: Bridget Elliott, female    DOB: 1955/06/09  Age: 69 y.o. MRN: [id number]  Chief Complaint  Patient presents with   Urinary Tract Infection    Started Dec 9th, frequency burning, pressure     Patient is a 68 year old female followed by Dr. Fabian Sharp who is seen for ongoing concern.  Pt seen 12/9 for UTI symptoms, started on cefdinir.  Seen 12/24 started on Macrobid.  Culture results from both visit with E. coli initially greater than 100 K then-100 K CFU's without resistance to ABX.  Patient contacted PCP via MyChart 9/12 for continued symptoms.  A longer course of Macrobid sent to the pharmacy.  Patient stopped taking the medicine 4 days ago and notes return of dysuria, pressure, frequency this morning.  Denies fever, chills, back pain, nausea, vomiting, abdominal pain, constipation.  Drinking 16 ounces of carbonated water and tea each day. Pt with h/o renal calculi.  No recent follow-up with urology.    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   Qualifier: History of  By: Genelle Gather CMA, [place]     Hx of varicella    Renal stone    hx of removal and lithotrypsy    Scarlet fever 03/21/2007   Qualifier: History of  By: Genelle Gather CMA, [place]     UTI (urinary tract infection) 10/22/2011   Past Surgical History:  Procedure Laterality Date   COLONOSCOPY  1841   kidney stone removal     left calf surgery     x2     TONSILLECTOMY     Social History   Tobacco Use   Smoking status: Never   Smokeless tobacco: Never  Vaping Use   Vaping status: Never Used  Substance Use Topics   Alcohol use: Yes    Alcohol/week: 0.0 standard drinks of alcohol    Comment: 5 glasses of wine a week   Drug  use: No   Family History  Problem Relation Age of Onset   Cancer Mother    Hypertension Mother    Hyperlipidemia Mother    Cancer Father        Lung,esophageal   Esophageal cancer Father    Heart attack Maternal Grandfather    Heart attack Paternal Grandfather    Colon cancer Neg Hx    Colon polyps Neg Hx    Rectal cancer Neg Hx    Stomach cancer Neg Hx    Pancreatic cancer Neg Hx    No Known Allergies    ROS Negative unless stated above    Objective:     BP 130/84 (BP Location: Left Arm, Patient Position: Sitting, Cuff Size: Normal)   Pulse 71   Temp 97.7 F (36.5 C) (Oral)   Ht 5' 4.5" (1.638 m)   Wt 141 lb 12.8 oz (64.3 kg)   SpO2 95%   BMI 23.96 kg/m  BP Readings from Last 3 Encounters:  02/25/23 130/84  01/22/23 124/74  01/07/23 138/82   Wt Readings from Last 3 Encounters:  02/25/23 141 lb 12.8 oz (64.3 kg)  02/05/23 139 lb (63 kg)  01/22/23 139 lb 12.8 oz (63.4 kg)      Physical Exam Constitutional:  General: She is not in acute distress.    Appearance: Normal appearance.  HENT:     Head: Normocephalic and atraumatic.     Nose: Nose normal.     Mouth/Throat:     Mouth: Mucous membranes are moist.  Cardiovascular:     Rate and Rhythm: Normal rate and regular rhythm.     Heart sounds: Normal heart sounds. No murmur heard.    No gallop.  Pulmonary:     Effort: Pulmonary effort is normal. No respiratory distress.     Breath sounds: Normal breath sounds. No wheezing, rhonchi or rales.  Abdominal:     General: Bowel sounds are normal.     Palpations: Abdomen is soft.     Tenderness: There is no abdominal tenderness. There is no right CVA tenderness, left CVA tenderness, guarding or rebound.  Skin:    General: Skin is warm and dry.  Neurological:     Mental Status: She is alert and oriented to person, place, and time.      Results for orders placed or performed in visit on 02/25/23  POCT Urinalysis Dipstick (Automated)  Result Value  Ref Range   Color, UA yellow    Clarity, UA cloudy    Glucose, UA Negative Negative   Bilirubin, UA neg    Ketones, UA neg    Spec Grav, UA 1.020 1.010 - 1.025   Blood, UA pos    pH, UA 6.5 5.0 - 8.0   Protein, UA Positive (A) Negative   Urobilinogen, UA 0.2 0.2 or 1.0 E.U./dL   Nitrite, UA neg    Leukocytes, UA Large (3+) (A) Negative      Assessment & Plan:  Suspected UTI -     Sulfamethoxazole-Trimethoprim; Take 1 tablet by mouth 2 (two) times daily for 7 days.  Dispense: 14 tablet; Refill: 0 -     Urinalysis, Routine w reflex microscopic  Urinary urgency -     POCT Urinalysis Dipstick (Automated) -     Urine Culture -     Urinalysis, Routine w reflex microscopic -     Ambulatory referral to Urology  Dysuria -     Urinalysis, Routine w reflex microscopic -     Ambulatory referral to Urology  Frequent UTI -     Ambulatory referral to Urology  Personal history of kidney stones -     Ambulatory referral to Urology  Patient with history of reoccurring UTI symptoms x 1-2 months s/p several rounds of Abx.  E. coli noted on UCX without resistance.  Concern recurring symptoms due to patient's decreased fluid intake.  Vaginal dryness due to may also be contributing to dysuria.  Also consider colonization.  Obtain UCX and microscopic.  Start Bactrim.  Given patient's recurring symptoms referral to urology.  Follow-up with PCP.  Return if symptoms worsen or fail to improve.   Deeann Saint, MD

## 2023-02-25 NOTE — Telephone Encounter (Signed)
Noted. Forwarding to provider for [facility]

## 2023-02-27 LAB — URINE CULTURE
MICRO NUMBER:: 16001719
SPECIMEN QUALITY:: ADEQUATE

## 2023-03-01 ENCOUNTER — Encounter: Payer: Self-pay | Admitting: Family Medicine

## 2023-03-13 ENCOUNTER — Ambulatory Visit: Payer: Medicare Other | Admitting: Urology

## 2023-03-13 ENCOUNTER — Encounter: Payer: Self-pay | Admitting: Urology

## 2023-03-13 VITALS — BP 155/80 | HR 83 | Ht 65.0 in | Wt 140.0 lb

## 2023-03-13 DIAGNOSIS — Z09 Encounter for follow-up examination after completed treatment for conditions other than malignant neoplasm: Secondary | ICD-10-CM | POA: Diagnosis not present

## 2023-03-13 DIAGNOSIS — R300 Dysuria: Secondary | ICD-10-CM | POA: Diagnosis not present

## 2023-03-13 DIAGNOSIS — N390 Urinary tract infection, site not specified: Secondary | ICD-10-CM

## 2023-03-13 DIAGNOSIS — Z87440 Personal history of urinary (tract) infections: Secondary | ICD-10-CM | POA: Diagnosis not present

## 2023-03-13 LAB — URINALYSIS, ROUTINE W REFLEX MICROSCOPIC
Bilirubin, UA: NEGATIVE
Glucose, UA: NEGATIVE
Ketones, UA: NEGATIVE
Nitrite, UA: NEGATIVE
RBC, UA: NEGATIVE
Specific Gravity, UA: 1.025 (ref 1.005–1.030)
Urobilinogen, Ur: 0.2 mg/dL (ref 0.2–1.0)
pH, UA: 5.5 (ref 5.0–7.5)

## 2023-03-13 LAB — MICROSCOPIC EXAMINATION
RBC, Urine: NONE SEEN /[HPF] (ref 0–2)
WBC, UA: >30 /[HPF] — AB (ref 0–5)

## 2023-03-13 MED ORDER — PREMARIN 0.625 MG/GM VA CREA: TOPICAL_CREAM | VAGINAL | 12 refills | Status: DC

## 2023-03-13 MED ORDER — SULFAMETHOXAZOLE-TRIMETHOPRIM 400-80 MG PO TABS: 1.0000 | ORAL_TABLET | Freq: Every evening | ORAL | 1 refills | Status: DC

## 2023-03-13 NOTE — Progress Notes (Addendum)
 Assessment: 1. Recurrent UTI      Plan: UTI prevention measures reviewed in detail today including proper hydration and hygiene recommendations.  I have recommended: cranberry supplementation urinary probiotic with lactobacillus vaginal estrogen cream low-dose antibiotic suppression (bactrim RS) for the next few months to break her recurrent UTI cycle.  Patient will return in 4 months for recheck.  Addendum;  UC from today + with pansens ecoli Will treat with macrobid x 5 days and then resume LD suppression  Chief Complaint: recurrent uti  History of Present Illness:  Bridget Elliott is a 68 y.o. female who is seen in consultation from Panosh, Neta Mends, MD for evaluation of recurrent UTI.  Over the last 3 months she has had 3 culture documented UTIs with pansensitive E. coli despite several courses of appropriate antibiotics.  Patient completed a course of Bactrim 4 days ago.  She currently reports that her UTI symptoms have resolved and her lower urinary tract symptoms are at baseline.  Urinalysis today continues to show marked pyuria otherwise negative except for few bacteria.  Will send for culture.   Patient does have a prior history of nephrolithiasis.  Last visit with [facility]-Dr. Alvester Morin 2021. She underwent ureteroscopy followed by lithotripsy back in the early 90s. She  has not had a stone since. She however developed some left-sided lower back pain  and for that reason underwent a CT scan renal stone protocol on 03/10/2019. This  revealed no evidence of obstructing stone. She had bilateral nonobstructing  renal calculi the largest of which was 0.8 mm in the lower pole of the right  kidney.  She has had no back or flank pain.  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   Qualifier: History of  By: Genelle Gather CMA, [place]     Hx of varicella    Renal stone    hx of removal and lithotrypsy    Scarlet fever 03/21/2007    Qualifier: History of  By: Genelle Gather CMA, [place]     UTI (urinary tract infection) 10/22/2011    Past Surgical History:  Past Surgical History:  Procedure Laterality Date   COLONOSCOPY  1841   kidney stone removal     left calf surgery     x2     TONSILLECTOMY      Allergies:  No Known Allergies  Family History:  Family History  Problem Relation Age of Onset   Cancer Mother    Hypertension Mother    Hyperlipidemia Mother    Cancer Father        Lung,esophageal   Esophageal cancer Father    Heart attack Maternal Grandfather    Heart attack Paternal Grandfather    Colon cancer Neg Hx    Colon polyps Neg Hx    Rectal cancer Neg Hx    Stomach cancer Neg Hx    Pancreatic cancer Neg Hx     Social History:  Social History   Tobacco Use   Smoking status: Never   Smokeless tobacco: Never  Vaping Use   Vaping status: Never Used  Substance Use Topics   Alcohol use: Yes    Alcohol/week: 0.0 standard drinks of alcohol    Comment: 5 glasses of wine a week   Drug use: No    Review of symptoms:  Constitutional:  Negative for unexplained weight loss, night sweats, fever, chills ENT:  Negative for nose bleeds, sinus pain, painful swallowing CV:  Negative for  chest pain, shortness of breath, exercise intolerance, palpitations, loss of consciousness Resp:  Negative for cough, wheezing, shortness of breath GI:  Negative for nausea, vomiting, diarrhea, bloody stools GU:  Positives noted in HPI; otherwise negative for gross hematuria, dysuria, urinary incontinence Neuro:  Negative for seizures, poor balance, limb weakness, slurred speech Psych:  Negative for lack of energy, depression, anxiety Endocrine:  Negative for polydipsia, polyuria, symptoms of hypoglycemia (dizziness, hunger, sweating) Hematologic:  Negative for anemia, purpura, petechia, prolonged or excessive bleeding, use of anticoagulants  Allergic:  Negative for difficulty breathing or choking as a result of exposure  to anything; no shellfish allergy; no allergic response (rash/itch) to materials, foods  Physical exam: BP (!) 155/80   Pulse 83   Ht 5\' 5"  (1.651 m)   Wt 140 lb (63.5 kg)   BMI 23.30 kg/m  GENERAL APPEARANCE:  Well appearing, well developed, well nourished, NAD   Results: UA is unremarkable except for significant pyuria and few bacteria.-Culture sent

## 2023-03-16 LAB — URINE CULTURE

## 2023-03-18 ENCOUNTER — Telehealth: Payer: Self-pay

## 2023-03-18 ENCOUNTER — Encounter: Payer: Self-pay | Admitting: Urology

## 2023-03-18 MED ORDER — NITROFURANTOIN MONOHYD MACRO 100 MG PO CAPS: 100.0000 mg | ORAL_CAPSULE | Freq: Two times a day (BID) | ORAL | 0 refills | Status: DC

## 2023-03-18 NOTE — Addendum Note (Signed)
 Addended by: Joline Maxcy on: 03/18/2023 12:02 PM   Modules accepted: Orders

## 2023-03-18 NOTE — Telephone Encounter (Signed)
 Spoke with pt, she is aware of the need for a different antibiotic. She verbalized stopping the nightly suppression therapy while on Macrobid.   Pt states that the vaginal estrogen cream was over $400. She stated that she would try the cranberry and probiotic for now and see if they help first. She wants more information about the long term effects of hormones prior to beginning them.

## 2023-03-18 NOTE — Telephone Encounter (Signed)
-----   Message from Joline Maxcy sent at 03/18/2023 12:01 PM EST ----- Regarding: UTI Her urine culture from 03/13/23 was positive. Please call her. I had started her on low dose bactrim nightly suppression. I have sent in new Rx for macrobid to take bid x 5 days.  Tell her not to take the bactrim while on the macrobid. Drue Second

## 2023-04-16 ENCOUNTER — Ambulatory Visit (HOSPITAL_BASED_OUTPATIENT_CLINIC_OR_DEPARTMENT_OTHER)
Admission: RE | Admit: 2023-04-16 | Discharge: 2023-04-16 | Disposition: A | Discharge status: Home / Self Care | Payer: Medicare Other | Source: Ambulatory Visit | Attending: Internal Medicine | Admitting: Internal Medicine

## 2023-04-16 DIAGNOSIS — K7689 Other specified diseases of liver: Secondary | ICD-10-CM | POA: Diagnosis not present

## 2023-04-22 ENCOUNTER — Ambulatory Visit: Payer: Self-pay | Admitting: Internal Medicine

## 2023-04-22 ENCOUNTER — Encounter: Payer: Self-pay | Admitting: Family Medicine

## 2023-04-22 ENCOUNTER — Ambulatory Visit (INDEPENDENT_AMBULATORY_CARE_PROVIDER_SITE_OTHER): Admitting: Family Medicine

## 2023-04-22 VITALS — BP 128/84 | HR 95 | Temp 97.6°F | Resp 16 | Ht 65.0 in | Wt 143.5 lb

## 2023-04-22 DIAGNOSIS — T2125XA Burn of second degree of buttock, initial encounter: Secondary | ICD-10-CM | POA: Diagnosis not present

## 2023-04-22 MED ORDER — SILVER SULFADIAZINE 1 % EX CREA
1.0000 | TOPICAL_CREAM | Freq: Every day | CUTANEOUS | 0 refills | Status: DC
Start: 2023-04-22 — End: 2023-09-12

## 2023-04-22 MED ORDER — TRAMADOL HCL 50 MG PO TABS: 50.0000 mg | ORAL_TABLET | Freq: Every evening | ORAL | 0 refills | Status: AC | PRN

## 2023-04-22 NOTE — Progress Notes (Signed)
 ACUTE VISIT Chief Complaint  Patient presents with   Burn    From heating pad, happened Wednesday night. On the upper right buttocks.    HPI: BridgetBridget Elliott is a 68 y.o. female with a PMHx significant for a liver lesion, bells palsy, TMJ syndrome, and elevated BP, who is here today with above complaint. Having lower back pain last week, slept with heating pad on right-sided sacral, woke up with soreness on area. She noted erythema , she ran room temp water on area. The next day a blister that burt a few days ago. Moderate pain, exacerbated by movement and palpation. Pain interferes with sleep.  Denies fever,chills, myalgias,or changes in appetite. She is keeping area covered with a bandage. Pain has improved. She has not noted purulent drainage.  She has not tried OTC medication,  Review of Systems  HENT:  Negative for mouth sores and sore throat.   Gastrointestinal:  Negative for abdominal pain, nausea and vomiting.  Genitourinary:  Negative for decreased urine volume, dysuria and hematuria.  Skin:  Positive for wound.  Neurological:  Negative for syncope, weakness and headaches.  Psychiatric/Behavioral:  Negative for confusion and hallucinations.   See other pertinent positives and negatives in HPI.  Current Outpatient Medications on File Prior to Visit  Medication Sig Dispense Refill   CALCIUM CITRATE PO Take by mouth.     Elderberry 575 MG/5ML SYRP Take by mouth.     Multiple Vitamins-Minerals (EMERGEN-C IMMUNE) PACK Take by mouth daily.     Probiotic Product (PROBIOTIC DAILY PO) Take by mouth.     valACYclovir (VALTREX) 1000 MG tablet Take 2 tablets (2,000 mg total) by mouth 2 (two) times daily. As needed 30 tablet 5   No current facility-administered medications on file prior to visit.    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   Qualifier: History of  By: Genelle Gather CMA, [place]     Hx of varicella    Renal stone    hx of removal and  lithotrypsy    Scarlet fever 03/21/2007   Qualifier: History of  By: Genelle Gather CMA, [place]     UTI (urinary tract infection) 10/22/2011   No Known Allergies  Social History   Socioeconomic History   Marital status: Married    Spouse name: Not on file   Number of children: Not on file   Years of education: Not on file   Highest education level: Bachelor's degree (e.g., BA, AB, BS)  Occupational History   Not on file  Tobacco Use   Smoking status: Never   Smokeless tobacco: Never  Vaping Use   Vaping status: Never Used  Substance and Sexual Activity   Alcohol use: Yes    Alcohol/week: 0.0 standard drinks of alcohol    Comment: 5 glasses of wine a week   Drug use: No   Sexual activity: Yes    Partners: Male  Other Topics Concern   Not on file  Social History Narrative   7 hours of sleep per night   Does not work outside the home   Lives alone with her husband   2 Cats in the home   G4P4   LIFESTYLE:    Exercise:     Tobacco/ETS:no   Alcohol: per day  0 - 5 per week    Sugar beverages:   Sleep: 7 hours    Drug use: no   Social Drivers of Corporate investment banker Strain: Low Risk  (  01/21/2023)   Overall Financial Resource Strain (CARDIA)    Difficulty of Paying Living Expenses: Not hard at all  Food Insecurity: No Food Insecurity (01/21/2023)   Hunger Vital Sign    Worried About Running Out of Food in the Last Year: Never true    Ran Out of Food in the Last Year: Never true  Transportation Needs: No Transportation Needs (01/21/2023)   PRAPARE - Administrator, Civil Service (Medical): No    Lack of Transportation (Non-Medical): No  Physical Activity: Sufficiently Active (01/21/2023)   Exercise Vital Sign    Days of Exercise per Week: 6 days    Minutes of Exercise per Session: 60 min  Stress: No Stress Concern Present (01/21/2023)   [employer] of Occupational Health - Occupational Stress Questionnaire    Feeling of Stress : Not at all   Social Connections: Socially Integrated (01/21/2023)   Social Connection and Isolation Panel [NHANES]    Frequency of Communication with Friends and Family: More than three times a week    Frequency of Social Gatherings with Friends and Family: Twice a week    Attends Religious Services: 1 to 4 times per year    Active Member of [employer] or Organizations: No    Attends Engineer, structural: More than 4 times per year    Marital Status: Married    Vitals:   04/22/23 1415  BP: 128/84  Pulse: 95  Resp: 16  Temp: 97.6 F (36.4 C)  SpO2: 99%   Body mass index is 23.88 kg/m.  Physical Exam Vitals and nursing note reviewed.  Constitutional:      General: She is not in acute distress.    Appearance: She is well-developed.  HENT:     Head: Normocephalic and atraumatic.  Eyes:     Conjunctiva/sclera: Conjunctivae normal.  Cardiovascular:     Rate and Rhythm: Normal rate and regular rhythm.     Heart sounds: No murmur heard. Pulmonary:     Effort: Pulmonary effort is normal. No respiratory distress.     Breath sounds: Normal breath sounds.  Skin:    General: Skin is warm.       Neurological:     General: No focal deficit present.     Mental Status: She is alert and oriented to person, place, and time.     Gait: Gait normal.  Psychiatric:        Mood and Affect: Mood and affect normal.     ASSESSMENT AND PLAN:  Bridget Elliott was seen today for a burn from a heating pad.   Partial thickness burn of buttock, initial encounter -     Silver sulfADIAZINE; Apply 1 Application topically daily.  Dispense: 50 g; Refill: 0 -     traMADol HCl; Take 1 tablet (50 mg total) by mouth at bedtime as needed for up to 5 days.  Dispense: 5 tablet; Refill: 0  2nd degree burn that resulted from sleeping on heating pad that did not turn off automatically. No signs of bacterial infection today and pain has improved some. She thinks her Tdap is up to date , she is not interested in  vaccination today. Keep area clean with soap and water, monitor for signs of injection. Uncover during the day and cover at night. Silver sulfadiazine daily for 7-10 days. Tramadol 50 mg at bedtime for pain management, some sdie effects discussed. Instructed about warning signs. F/U in 2 weeks , before if needed.  I spent  a total of 30 minutes in both face to face and non face to face activities for this visit on the date of this encounter. During this time history was obtained and documented, examination was performed, and assessment/plan discussed.  Return in about 2 weeks (around 05/06/2023).  Khiem Gargis G. [place], MD  [facility]. [place] office.

## 2023-04-22 NOTE — Telephone Encounter (Signed)
 Chief Complaint: Blister Symptoms: Blister that has ruptured, red Frequency: since Wednesday Pertinent Negatives: Patient denies fever Disposition: [] ED /[] Urgent Care (no appt availability in office) / [x] Appointment(In office/virtual)/ []  [facility] [facility]/ [] Home Care/ [] Refused Recommended Disposition /[] [facility] [facility]/ []  Follow-up with PCP Additional Notes: Patient obtained blister from heating pad on upper buttock on Wednesday. Patient states it created a blister that ruptured on Thursday, but is now red around the blister. Patient leaves town tomorrow and would like further evaluation to rule out infection. Patient denies fever.   Copied from CRM [id number]. Topic: Clinical - Red Word Triage >> Apr 22, 2023 11:01 AM [place] A wrote: [facility] that prompted transfer to Nurse Triage: Patient has burn from heating pad; burn had blister that has drained pain level is a 5 on scale 1-10 Reason for Disposition  [1] Looks infected (spreading redness, pus) AND [2] no fever  Answer Assessment - Initial Assessment Questions 1. ONSET: "When did it happen?" If happened < 3 hours ago, ask: "Did you apply cold water?" If not, give First Aid Advice immediately.      Wednesday night 2. LOCATION: "Where is the burn located?"      Upper right buttock 3. BURN SIZE: "How large is the burn?"  The palm is roughly 1% of the total body surface area (BSA).     About 3.5 inches round 4. SEVERITY OF THE BURN: "Are there any blisters?"      Yes - there was a blister that drained on Thursday 5. MECHANISM: "Tell me how it happened."     Heating pad 6. PAIN: "Are you having any pain?" "How bad is the pain?" (Scale 1-10; or mild, moderate, severe)   - MILD (1-3): doesn't interfere with normal activities    - MODERATE (4-7): interferes with normal activities or awakens from sleep    - SEVERE (8-10): excruciating pain, unable to do any normal activities      Moderate pain 7. INHALATION INJURY: "Were  you exposed to any smoke or fumes?" If Yes, ask: "Do you have any cough or difficulty breathing?"     No 8. OTHER SYMPTOMS: "Do you have any other symptoms?" (e.g., headache, nausea)     No  Protocols used: Burns - Thermal-A-AH

## 2023-04-22 NOTE — Patient Instructions (Addendum)
 A few things to remember from today's visit:  Burn - Plan: silver sulfADIAZINE (SILVADENE) 1 % cream, traMADol (ULTRAM) 50 MG tablet  If you need refills for medications you take chronically, please call your pharmacy. Do not use My Chart to request refills or for acute issues that need immediate attention. If you send a my chart message, it may take a few days to be addressed, specially if I am not in the office.  Please be sure medication list is accurate. If a new problem present, please set up appointment sooner than planned today.

## 2023-04-23 ENCOUNTER — Encounter: Payer: Self-pay | Admitting: Internal Medicine

## 2023-04-23 NOTE — Progress Notes (Signed)
 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 Pleaes order a fu [place] for 6 mos  to follow liver lesions  And make sure has yearly PV when du in Summer?

## 2023-04-30 ENCOUNTER — Other Ambulatory Visit (HOSPITAL_BASED_OUTPATIENT_CLINIC_OR_DEPARTMENT_OTHER): Payer: Self-pay

## 2023-04-30 DIAGNOSIS — K7689 Other specified diseases of liver: Secondary | ICD-10-CM

## 2023-05-02 ENCOUNTER — Ambulatory Visit: Payer: Self-pay

## 2023-05-02 ENCOUNTER — Ambulatory Visit: Admitting: Family Medicine

## 2023-05-02 ENCOUNTER — Encounter: Payer: Self-pay | Admitting: Family Medicine

## 2023-05-02 VITALS — BP 130/88 | HR 83 | Temp 97.5°F | Ht 65.0 in | Wt 141.8 lb

## 2023-05-02 DIAGNOSIS — L089 Local infection of the skin and subcutaneous tissue, unspecified: Secondary | ICD-10-CM

## 2023-05-02 DIAGNOSIS — T148XXA Other injury of unspecified body region, initial encounter: Secondary | ICD-10-CM

## 2023-05-02 MED ORDER — MEDIHONEY WOUND/BURN DRESSING EX GEL
1.0000 | Freq: Every day | CUTANEOUS | 2 refills | Status: DC
Start: 2023-05-02 — End: 2023-09-12

## 2023-05-02 MED ORDER — CIPROFLOXACIN HCL 500 MG PO TABS: 500.0000 mg | ORAL_TABLET | Freq: Two times a day (BID) | ORAL | 0 refills | Status: AC

## 2023-05-02 MED ORDER — COLLAGENASE 250 UNIT/GM EX OINT: 1.0000 | TOPICAL_OINTMENT | Freq: Every day | CUTANEOUS | 0 refills | Status: DC

## 2023-05-02 NOTE — Telephone Encounter (Signed)
 Copied from CRM [id number]. Topic: Clinical - Red Word Triage >> May 02, 2023  8:09 AM [place] A wrote: [facility] that prompted transfer to Nurse Triage: Patient has burn on located on right upper buttocks and is starting to smell and liquid is coming out-no fever  Chief Complaint: thermal burn from heating blanket Symptoms: foul odor, scabbed area Frequency: constant Pertinent Negatives: Patient denies fever Disposition: [] ED /[] Urgent Care (no appt availability in office) / [x] Appointment(In office/virtual)/ []  [facility]/ [] Home Care/ [] Refused Recommended Disposition /[] [facility]/ []  Follow-up with PCP Additional Notes: was seen in office for this when it first happened; now area is painful and has odor with drainage.  Apt made for today; care advice given, denies questions; instructed to go to ER if becomes worse.   Reason for Disposition  [1] Looks infected (spreading redness, red streak, pus) AND [2] fever  Answer Assessment - Initial Assessment Questions 1. ONSET: "When did it happen?" If happened < 3 hours ago, ask: "Did you apply cold water?" If not, give First Aid Advice immediately.      2 weeks 2. LOCATION: "Where is the burn located?"      From heating blanket to right upper buttock 3. BURN SIZE: "How large is the burn?"  The palm is roughly 1% of the total body surface area (BSA).     3.5 inches 4. SEVERITY OF THE BURN: "Are there any blisters?"      Stage 2 burn orginally 5. MECHANISM: "Tell me how it happened."     Heating blanket 6. PAIN: "Are you having any pain?" "How bad is the pain?" (Scale 1-10; or mild, moderate, severe)   - MILD (1-3): doesn't interfere with normal activities    - MODERATE (4-7): interferes with normal activities or awakens from sleep    - SEVERE (8-10): excruciating pain, unable to do any normal activities      5/10 7. INHALATION INJURY: "Were you exposed to any smoke or fumes?" If Yes, ask: "Do you have any cough or  difficulty breathing?"     no 8. OTHER SYMPTOMS: "Do you have any other symptoms?" (e.g., headache, nausea)     Denies fever 9. PREGNANCY: "Is there any chance you are pregnant?" "When was your last menstrual period?"     na  Protocols used: [facility]

## 2023-05-02 NOTE — Patient Instructions (Addendum)
 Apply Santyl (collagenase) ointment once daily with DRY dressings to help debride the wound and get rid of the yellow tissue  Once the yellow tissue is gone and there should be red "granulation tissue" underneath-- should look similar to raw hamburger meat  Once you see that and the yellow tissue is gone -- go back to the silver cream or medi honey.   Cipro will be twice a day for 7 days

## 2023-05-02 NOTE — Progress Notes (Signed)
 Established Patient Office Visit  Subjective   Patient ID: Bridget Elliott, female    DOB: Jul 03, 1955  Age: 68 y.o. MRN: [id number]  Chief Complaint  Patient presents with   Burn    Patient complains of recurrent odor to the burn located on her buttocks from 2 weeks ago    Pt is being seen today for follow up on the burn she sustained two weeks ago, was seen initially by Dr. [place] and was given silver cream to put on the wound, states that it seemed to be healing however she has been traveling in the car a lot recently and was trying to off load the pressure, but yesterday when she was changing the bandage it was getting very smelly, not really painful, no fever or chills, maybe has gotten a little more swollen and there is more drainage on the bandage    Current Outpatient Medications  Medication Instructions   CALCIUM CITRATE PO Take by mouth.   ciprofloxacin (CIPRO) 500 mg, Oral, 2 times daily   collagenase (SANTYL) 250 UNIT/GM ointment 1 Application, Topical, Daily   Elderberry 575 MG/5ML SYRP Take by mouth.   leptospermum manuka honey (MEDIHONEY) gel 1 Application, Topical, Daily   Multiple Vitamins-Minerals (EMERGEN-C IMMUNE) PACK Daily   Probiotic Product (PROBIOTIC DAILY PO) Take by mouth.   silver sulfADIAZINE (SILVADENE) 1 % cream 1 Application, Topical, Daily   valACYclovir (VALTREX) 2,000 mg, Oral, 2 times daily, As needed    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   TONSILLECTOMY, HX OF 03/21/2007      Review of Systems  Constitutional:  Negative for chills and fever.  All other systems reviewed and are negative.     Objective:     BP 130/88   Pulse 83   Temp (!) 97.5 F (36.4 C) (Oral)   Ht 5\' 5"  (1.651 m)   Wt 141 lb 12.8 oz (64.3 kg)   SpO2 98%   BMI 23.60 kg/m    Physical Exam Vitals  reviewed.  Constitutional:      Appearance: Normal appearance. She is normal weight.  Pulmonary:     Effort: Pulmonary effort is normal.  Skin:    Findings: Burn (there is a large circular wound on the right buttock -- about 5-6 cm in diameter, the wound is foul smelling with purulent drainage. there is a thick layer of yellow necrotic tissue covering the wound bed,) present.  Neurological:     Mental Status: She is alert.      No results found for any visits on 05/02/23.    The 10-year ASCVD risk score (Arnett DK, et al., 2019) is: 6.4%    Assessment & Plan:  Wound infection -     Ciprofloxacin HCl; Take 1 tablet (500 mg total) by mouth 2 (two) times daily for 7 days.  Dispense: 14 tablet; Refill: 0 -     WOUND CULTURE -     Collagenase; Apply 1 Application topically daily.  Dispense: 90 g; Refill: 0 -     Medihoney Wound/Burn Dressing; Apply 1 Application topically daily.  Dispense: 44 mL; Refill: 2   I attempted a blunt wound debridement today with a #15 blade however the necrotic material was too thick to perform in office. I sent a wound culture and started the patient on cipro for the  wound infection (was concerned about gram negative infection as well as gram positive). I advised her on wound care and I changed her ointment to collagenase (santyl) ointment to properly debride the wound -- advised her on DRY dressing changes daily to help with wound debridement. She will need regular wound checks to ensure proper healing. Written instructions provided to the patient. I spent 30+ minutes with the patient today counseling her on this topic and providing instructions both written and verbal, and attempting blunt dissection today in the office.   Return in about 2 weeks (around 05/16/2023) for needs wound check in 2-3 weeks.    Karie Georges, MD

## 2023-05-05 ENCOUNTER — Encounter: Payer: Self-pay | Admitting: Family Medicine

## 2023-05-05 LAB — WOUND CULTURE
MICRO NUMBER:: 16285333
SPECIMEN QUALITY:: ADEQUATE

## 2023-05-07 NOTE — Progress Notes (Unsigned)
 No chief complaint on file.   HPI: Bridget Elliott 68 y.o. come in for Chronic disease management   LAD 60.8 57 %ile  ROS: See pertinent positives and negatives per HPI.  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   Qualifier: History of  By: Genelle Gather CMA, [place]     Hx of varicella    Renal stone    hx of removal and lithotrypsy    Scarlet fever 03/21/2007   Qualifier: History of  By: Genelle Gather CMA, [place]     UTI (urinary tract infection) 10/22/2011    Family History  Problem Relation Age of Onset   Cancer Mother    Hypertension Mother    Hyperlipidemia Mother    Cancer Father        Lung,esophageal   Esophageal cancer Father    Heart attack Maternal Grandfather    Heart attack Paternal Grandfather    Colon cancer Neg Hx    Colon polyps Neg Hx    Rectal cancer Neg Hx    Stomach cancer Neg Hx    Pancreatic cancer Neg Hx     Social History   Socioeconomic History   Marital status: Married    Spouse name: Not on file   Number of children: Not on file   Years of education: Not on file   Highest education level: Bachelor's degree (e.g., BA, AB, BS)  Occupational History   Not on file  Tobacco Use   Smoking status: Never   Smokeless tobacco: Never  Vaping Use   Vaping status: Never Used  Substance and Sexual Activity   Alcohol use: Yes    Alcohol/week: 0.0 standard drinks of alcohol    Comment: 5 glasses of wine a week   Drug use: No   Sexual activity: Yes    Partners: Male  Other Topics Concern   Not on file  Social History Narrative   7 hours of sleep per night   Does not work outside the home   Lives alone with her husband   2 Cats in the home   G4P4   LIFESTYLE:    Exercise:     Tobacco/ETS:no   Alcohol: per day  0 - 5 per week    Sugar beverages:   Sleep: 7 hours    Drug use: no   Social Drivers of Corporate investment banker Strain: Low Risk  (01/21/2023)   Overall Financial Resource Strain (CARDIA)    Difficulty of  Paying Living Expenses: Not hard at all  Food Insecurity: No Food Insecurity (01/21/2023)   Hunger Vital Sign    Worried About Running Out of Food in the Last Year: Never true    Ran Out of Food in the Last Year: Never true  Transportation Needs: No Transportation Needs (01/21/2023)   PRAPARE - Administrator, Civil Service (Medical): No    Lack of Transportation (Non-Medical): No  Physical Activity: Sufficiently Active (01/21/2023)   Exercise Vital Sign    Days of Exercise per Week: 6 days    Minutes of Exercise per Session: 60 min  Stress: No Stress Concern Present (01/21/2023)   [employer] of Occupational Health - Occupational Stress Questionnaire    Feeling of Stress : Not at all  Social Connections: Socially Integrated (01/21/2023)   Social Connection and Isolation Panel [NHANES]    Frequency of Communication with Friends and Family: More than three times a week    Frequency of Social  Gatherings with Friends and Family: Twice a week    Attends Religious Services: 1 to 4 times per year    Active Member of [employer] or Organizations: No    Attends Engineer, structural: More than 4 times per year    Marital Status: Married    Outpatient Medications Prior to Visit  Medication Sig Dispense Refill   CALCIUM CITRATE PO Take by mouth.     ciprofloxacin (CIPRO) 500 MG tablet Take 1 tablet (500 mg total) by mouth 2 (two) times daily for 7 days. 14 tablet 0   collagenase (SANTYL) 250 UNIT/GM ointment Apply 1 Application topically daily. 90 g 0   Elderberry 575 MG/5ML SYRP Take by mouth.     leptospermum manuka honey (MEDIHONEY) gel Apply 1 Application topically daily. 44 mL 2   Multiple Vitamins-Minerals (EMERGEN-C IMMUNE) PACK Take by mouth daily.     Probiotic Product (PROBIOTIC DAILY PO) Take by mouth.     silver sulfADIAZINE (SILVADENE) 1 % cream Apply 1 Application topically daily. 50 g 0   valACYclovir (VALTREX) 1000 MG tablet Take 2 tablets (2,000 mg total)  by mouth 2 (two) times daily. As needed 30 tablet 5   No facility-administered medications prior to visit.     EXAM:  There were no vitals taken for this visit.  There is no height or weight on file to calculate BMI.  GENERAL: vitals reviewed and listed above, alert, oriented, appears well hydrated and in no acute distress HEENT: atraumatic, conjunctiva  clear, no obvious abnormalities on inspection of external nose and ears OP : no lesion edema or exudate  NECK: no obvious masses on inspection palpation  LUNGS: clear to auscultation bilaterally, no wheezes, rales or rhonchi, good air movement CV: HRRR, no clubbing cyanosis or  peripheral edema nl cap refill  MS: moves all extremities without noticeable focal  abnormality PSYCH: pleasant and cooperative, no obvious depression or anxiety Lab Results  Component Value Date   WBC 6.6 02/11/2023   HGB 15.0 02/11/2023   HCT 45.3 (H) 02/11/2023   PLT 361 02/11/2023   GLUCOSE 81 09/27/2022   CHOL 233 (H) 09/27/2022   TRIG 84.0 09/27/2022   HDL 66.30 09/27/2022   LDLDIRECT 146.9 09/20/2009   LDLCALC 150 (H) 09/27/2022   ALT 20 09/27/2022   AST 21 09/27/2022   NA 140 09/27/2022   K 4.1 09/27/2022   CL 103 09/27/2022   CREATININE 0.76 09/27/2022   BUN 23 09/27/2022   CO2 30 09/27/2022   TSH 1.12 09/27/2022   HGBA1C 5.8 09/27/2022   BP Readings from Last 3 Encounters:  05/02/23 130/88  04/22/23 128/84  03/13/23 (!) 155/80    ASSESSMENT AND PLAN:  Discussed the following assessment and plan:  No diagnosis found. Lipo a  -Patient advised to return or notify health care team  if  new concerns arise.  There are no Patient Instructions on file for this visit.   Neta Mends. Jamus Loving M.D.

## 2023-05-08 ENCOUNTER — Ambulatory Visit (INDEPENDENT_AMBULATORY_CARE_PROVIDER_SITE_OTHER): Admitting: Internal Medicine

## 2023-05-08 ENCOUNTER — Encounter: Payer: Self-pay | Admitting: Internal Medicine

## 2023-05-08 VITALS — BP 130/76 | HR 92 | Temp 97.7°F | Ht 65.0 in | Wt 141.1 lb

## 2023-05-08 DIAGNOSIS — E7800 Pure hypercholesterolemia, unspecified: Secondary | ICD-10-CM | POA: Diagnosis not present

## 2023-05-08 DIAGNOSIS — T148XXA Other injury of unspecified body region, initial encounter: Secondary | ICD-10-CM | POA: Diagnosis not present

## 2023-05-08 DIAGNOSIS — E785 Hyperlipidemia, unspecified: Secondary | ICD-10-CM | POA: Diagnosis not present

## 2023-05-08 DIAGNOSIS — L089 Local infection of the skin and subcutaneous tissue, unspecified: Secondary | ICD-10-CM

## 2023-05-08 DIAGNOSIS — T2125XD Burn of second degree of buttock, subsequent encounter: Secondary | ICD-10-CM

## 2023-05-08 DIAGNOSIS — R931 Abnormal findings on diagnostic imaging of heart and coronary circulation: Secondary | ICD-10-CM

## 2023-05-08 NOTE — Patient Instructions (Addendum)
 LIpo a level today . Consider statin .     Says  we discussed. Fu  wound  consider  seeing  PS for wound  fu . But continue  as planned.

## 2023-05-12 LAB — LIPOPROTEIN A (LPA): Lipoprotein (a): 16 nmol/L (ref <75)

## 2023-05-14 DIAGNOSIS — K080 Exfoliation of teeth due to systemic causes: Secondary | ICD-10-CM | POA: Diagnosis not present

## 2023-05-15 ENCOUNTER — Encounter: Payer: Self-pay | Admitting: Internal Medicine

## 2023-05-15 NOTE — Progress Notes (Signed)
 Lipo a level is favorable

## 2023-05-22 ENCOUNTER — Ambulatory Visit (INDEPENDENT_AMBULATORY_CARE_PROVIDER_SITE_OTHER): Admitting: Internal Medicine

## 2023-05-22 ENCOUNTER — Encounter: Payer: Self-pay | Admitting: Internal Medicine

## 2023-05-22 VITALS — BP 130/80 | HR 93 | Temp 97.9°F | Ht 65.0 in | Wt 141.8 lb

## 2023-05-22 DIAGNOSIS — E7800 Pure hypercholesterolemia, unspecified: Secondary | ICD-10-CM | POA: Diagnosis not present

## 2023-05-22 DIAGNOSIS — E785 Hyperlipidemia, unspecified: Secondary | ICD-10-CM

## 2023-05-22 DIAGNOSIS — T2125XD Burn of second degree of buttock, subsequent encounter: Secondary | ICD-10-CM | POA: Diagnosis not present

## 2023-05-22 DIAGNOSIS — R931 Abnormal findings on diagnostic imaging of heart and coronary circulation: Secondary | ICD-10-CM | POA: Diagnosis not present

## 2023-05-22 NOTE — Patient Instructions (Addendum)
 Wound looks very good. Can Continue lifestyle intervention healthy eating and exercise . And recheck fasting lipid panel in next 2-3 months .

## 2023-05-22 NOTE — Progress Notes (Signed)
 Chief Complaint  Patient presents with   Follow-up    Pt reports wound is healing well.     HPI: Bridget Elliott 68 y.o. come in for fu wound check   doing so much better using the Medihoney patches gauze   wound debrided a few days after  last visit . And   now feeling better  on topical care .   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    Scarlet fever 03/21/2007   Qualifier: History of  By: Sharlette Dayhoff CMA, [place]     UTI (urinary tract infection) 10/22/2011    Family History  Problem Relation Age of Onset   Cancer Mother    Hypertension Mother    Hyperlipidemia Mother    Cancer Father        Lung,esophageal   Esophageal cancer Father    Heart attack Maternal Grandfather    Heart attack Paternal Grandfather    Colon cancer Neg Hx    Colon polyps Neg Hx    Rectal cancer Neg Hx    Stomach cancer Neg Hx    Pancreatic cancer Neg Hx     Social History   Socioeconomic History   Marital status: Married    Spouse name: Not on file   Number of children: Not on file   Years of education: Not on file   Highest education level: Bachelor's degree (e.g., BA, AB, BS)  Occupational History   Not on file  Tobacco Use   Smoking status: Never   Smokeless tobacco: Never  Vaping Use   Vaping status: Never Used  Substance and Sexual Activity   Alcohol use: Yes    Alcohol/week: 0.0 standard drinks of alcohol    Comment: 5 glasses of wine a week   Drug use: No   Sexual activity: Yes    Partners: Male  Other Topics Concern   Not on file  Social History Narrative   7 hours of sleep per night   Does not work outside the home   Lives alone with her husband   2 Cats in the home   G4P4   LIFESTYLE:    Exercise:     Tobacco/ETS:no    Alcohol: per day  0 - 5 per week    Sugar beverages:   Sleep: 7 hours    Drug use: no   Social Drivers of Corporate investment banker Strain: Low Risk  (01/21/2023)   Overall Financial Resource Strain (CARDIA)    Difficulty of Paying Living Expenses: Not hard at all  Food Insecurity: No Food Insecurity (01/21/2023)   Hunger Vital Sign    Worried About Running Out of Food in the Last Year: Never true    Ran Out of Food in the Last Year: Never true  Transportation Needs: No Transportation Needs (01/21/2023)   PRAPARE - Administrator, Civil Service (Medical): No    Lack of Transportation (Non-Medical): No  Physical Activity: Sufficiently Active (01/21/2023)   Exercise Vital Sign    Days of Exercise per Week: 6 days    Minutes of Exercise per Session: 60 min  Stress: No Stress Concern Present (01/21/2023)  [employer] of Occupational Health - Occupational Stress Questionnaire    Feeling of Stress : Not at all  Social Connections: Socially Integrated (01/21/2023)   Social Connection and Isolation Panel [NHANES]    Frequency of Communication with Friends and Family: More than three times a week    Frequency of Social Gatherings with Friends and Family: Twice a week    Attends Religious Services: 1 to 4 times per year    Active Member of [employer] or Organizations: No    Attends Engineer, structural: More than 4 times per year    Marital Status: Married    Outpatient Medications Prior to Visit  Medication Sig Dispense Refill   CALCIUM  CITRATE PO Take by mouth.     Elderberry 575 MG/5ML SYRP Take by mouth.     leptospermum manuka honey (MEDIHONEY) gel Apply 1 Application topically daily. 44 mL 2   Multiple Vitamins-Minerals (EMERGEN-C IMMUNE) PACK Take by mouth daily.     Probiotic Product (PROBIOTIC DAILY PO) Take by mouth.     valACYclovir  (VALTREX ) 1000 MG tablet Take 2 tablets (2,000 mg total) by mouth 2 (two) times daily. As needed 30 tablet 5   silver   sulfADIAZINE  (SILVADENE ) 1 % cream Apply 1 Application topically daily. (Patient not taking: Reported on 05/22/2023) 50 g 0   collagenase  (SANTYL ) 250 UNIT/GM ointment Apply 1 Application topically daily. 90 g 0   No facility-administered medications prior to visit.     EXAM:  BP 130/80 (BP Location: Left Arm, Patient Position: Sitting, Cuff Size: Normal)   Pulse 93   Temp 97.9 F (36.6 C) (Oral)   Ht 5\' 5"  (1.651 m)   Wt 141 lb 12.8 oz (64.3 kg)   SpO2 97%   BMI 23.60 kg/m   Body mass index is 23.6 kg/m.  GENERAL: vitals reviewed and listed above, alert, oriented, appears well hydrated and in no acute distress HEENT: atraumatic, conjunctiva  clear, no obvious abnormalities on inspection of external nose and ears NECK: no obvious masses on inspection palpation   Wound back sacral area   healing well about 3 cm superficial smooth healing  lesion clean . MS: moves all extremities without noticeable focal  abnormality PSYCH: pleasant and cooperative, no obvious depression or anxiety Lab Results  Component Value Date   WBC 6.6 02/11/2023   HGB 15.0 02/11/2023   HCT 45.3 (H) 02/11/2023   PLT 361 02/11/2023   GLUCOSE 81 09/27/2022   CHOL 233 (H) 09/27/2022   TRIG 84.0 09/27/2022   HDL 66.30 09/27/2022   LDLDIRECT 146.9 09/20/2009   LDLCALC 150 (H) 09/27/2022   ALT 20 09/27/2022   AST 21 09/27/2022   NA 140 09/27/2022   K 4.1 09/27/2022   CL 103 09/27/2022   CREATININE 0.76 09/27/2022   BUN 23 09/27/2022   CO2 30 09/27/2022   TSH 1.12 09/27/2022   HGBA1C 5.8 09/27/2022   BP Readings from Last 3 Encounters:  05/22/23 130/80  05/08/23 130/76  05/02/23 130/88    ASSESSMENT AND PLAN:  Discussed the following assessment and plan:  Partial thickness burn of buttock, subsequent encounter - improving  Hyperlipidemia, unspecified hyperlipidemia type - Plan: Lipid panel  Elevated LDL cholesterol level - Plan: Lipid panel  Elevated coronary artery calcium  score  55%  ile - Plan: Lipid panel Disc lipid risk   at this time  will cont lsi  and repeat lipid panel in a few months  lipo a is  favorable . -Patient advised  to return or notify health care team  if  new concerns arise.  Patient Instructions  Wound looks very good. Can Continue lifestyle intervention healthy eating and exercise . And recheck fasting lipid panel in next 2-3 months . Suhayla Chisom K. Victoriah Wilds M.D.

## 2023-07-02 ENCOUNTER — Ambulatory Visit (INDEPENDENT_AMBULATORY_CARE_PROVIDER_SITE_OTHER): Admitting: Family Medicine

## 2023-07-02 ENCOUNTER — Encounter: Payer: Self-pay | Admitting: Family Medicine

## 2023-07-02 DIAGNOSIS — Z0000 Encounter for general adult medical examination without abnormal findings: Secondary | ICD-10-CM

## 2023-07-02 NOTE — Progress Notes (Signed)
 Patient was unable to self-report due to a lack of equipment at home via telehealth

## 2023-07-02 NOTE — Progress Notes (Signed)
 PATIENT CHECK-IN and HEALTH RISK ASSESSMENT QUESTIONNAIRE:  -completed by phone/video for upcoming Medicare Preventive Visit  Pre-Visit Check-in: 1)Vitals (height, wt, BP, etc) - record in vitals section for visit on day of visit Request home vitals (wt, BP, etc.) and enter into vitals, THEN update Vital Signs SmartPhrase below at the top of the HPI. See below.  2)Review and Update Medications, Allergies PMH, Surgeries, Social history in Epic 3)Hospitalizations in the last year with date/reason? NO   4)Review and Update Care Team (patient's specialists) in Epic 5) Complete PHQ9 in Epic  6) Complete Fall Screening in Epic 7)Review all Health Maintenance Due and order under PCP if not done.  Medicare Wellness Patient Questionnaire:  Answer theses question about your habits: How often do you have a drink containing alcohol?Yes - rarely when goes out to dinner, maybe a few times a month How many drinks containing alcohol do you have on a typical day when you are drinking?2 How often do you have six or more drinks on one occasion?No  Have you ever smoked?No  Quit date if applicable? Na   How many packs a day do/did you smoke? Na  Do you use smokeless tobacco?NO  Do you use an illicit drugs?No  On average, how many days per week do you engage in moderate to strenuous exercise (like a brisk walk)?Yes, 6 x weeks  - walks for 1 hour per day On average, how many minutes do you engage in exercise at this level?60 Mins Are you sexually active? Yes Number of partners?1 Typical breakfast: Oatmeal yogurt, blueberries, nuts  Typical lunch:Tuna, salad, or grill cheese or pasta  Typical dinner:Grilled chicken or grilled Fish, vegetable, wild rice,  Typical snacks:fruit, humus   Beverages: Water, unsweetened ice tea, coffee Social connection: strong, reports has social groups and active with kids and grandkids  Answer theses question about your everyday activities: Can you perform most household  chores?Yes  Are you deaf or have significant trouble hearing?No  Do you feel that you have a problem with memory?No Do you feel safe at home?Yes  Last dentist visit? month ago  8. Do you have any difficulty performing your everyday activities?No  Are you having any difficulty walking, taking medications on your own, and or difficulty managing daily home needs?No Do you have difficulty walking or climbing stairs?No  Do you have difficulty dressing or bathing?No  Do you have difficulty doing errands alone such as visiting a doctor's office or shopping?No Do you currently have any difficulty preparing food and eating?No  Do you currently have any difficulty using the toilet?No Do you have any difficulty managing your finances?No  Do you have any difficulties with housekeeping of managing your housekeeping?NO    Do you have Advanced Directives in place (Living Will, Healthcare Power or Attorney)? Yes    Last eye Exam and location?over a Year, [place], ophthalmology    Do you currently use prescribed or non-prescribed narcotic or opioid pain medications?NO   Do you have a history or close family history of breast, ovarian, tubal or peritoneal cancer or a family member with BRCA (breast cancer susceptibility 1 and 2) gene mutations? No - except father who was smoker and drinker had esophageal ca.    Nurse/Assistant Credentials/time stamp:Leah A.Wright CMA 10:42 am     ----------------------------------------------------------------------------------------------------------------------------------------------------------------------------------------------------------------------  Because this visit was a virtual/telehealth visit, some criteria may be missing or patient reported. Any vitals not documented were not able to be obtained and vitals that have been documented are patient  reported.    MEDICARE ANNUAL PREVENTIVE VISIT WITH PROVIDER: (Welcome to Medicare, initial annual  wellness or annual wellness exam)  Virtual Visit via Video Note  I connected with Bridget Elliott on 07/02/23 by a video enabled telemedicine application and verified that I am speaking with the correct person using two identifiers.  Location patient: home Location provider:work or home office Persons participating in the virtual visit: patient, provider  Concerns and/or follow up today: Doing well. Reports does a lot of outdoor gardening and yard work and had a little back pain recently - says she will see Dr. Ethel Henry if returns or recurs. Denies weakness, numbness, radiation, bowel or bladder dysfunction.    See HM section in Epic for other details of completed HM.    ROS: negative for report of fevers, unintentional weight loss, vision changes, vision loss, hearing loss or change, chest pain, sob, hemoptysis, melena, hematochezia, hematuria, falls, bleeding or bruising, thoughts of suicide or self harm, memory loss  Patient-completed extensive health risk assessment - reviewed and discussed with the patient: See Health Risk Assessment completed with patient prior to the visit either above or in recent phone note. This was reviewed in detailed with the patient today and appropriate recommendations, orders and referrals were placed as needed per Summary below and patient instructions.   Review of Medical History: -PMH, PSH, Family History and current specialty and care providers reviewed and updated and listed below   Patient Care Team: Panosh, Joaquim Muir, MD as PCP - General (Internal Medicine) Sheryle Donning, MD as PCP - Cardiology (Cardiology) Woodrow Hazy, MD as Consulting Physician (Obstetrics and Gynecology) [facility] Opthalmology (Ophthalmology) Thais Fill, MD as Consulting Physician (Dermatology)   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   Qualifier: History of  By: Sharlette Dayhoff CMA, [place]     Hx of varicella    Renal stone    hx of removal  and lithotrypsy    Scarlet fever 03/21/2007   Qualifier: History of  By: Sharlette Dayhoff CMA, [place]     UTI (urinary tract infection) 10/22/2011    Past Surgical History:  Procedure Laterality Date   COLONOSCOPY  1841   kidney stone removal     left calf surgery     x2     TONSILLECTOMY      Social History   Socioeconomic History   Marital status: Married    Spouse name: Not on file   Number of children: Not on file   Years of education: Not on file   Highest education level: Bachelor's degree (e.g., BA, AB, BS)  Occupational History   Not on file  Tobacco Use   Smoking status: Never   Smokeless tobacco: Never  Vaping Use   Vaping status: Never Used  Substance and Sexual Activity   Alcohol use: Yes    Alcohol/week: 0.0 standard drinks of alcohol    Comment: 5 glasses of wine a week   Drug use: No   Sexual activity: Yes    Partners: Male  Other Topics Concern   Not on file  Social History Narrative   7 hours of sleep per night   Does not work outside the home   Lives alone with her husband   2 Cats in the home   G4P4   LIFESTYLE:    Exercise:     Tobacco/ETS:no   Alcohol: per day  0 - 5 per week    Sugar beverages:   Sleep: 7 hours  Drug use: no   Social Drivers of Corporate investment banker Strain: Low Risk  (01/21/2023)   Overall Financial Resource Strain (CARDIA)    Difficulty of Paying Living Expenses: Not hard at all  Food Insecurity: No Food Insecurity (01/21/2023)   Hunger Vital Sign    Worried About Running Out of Food in the Last Year: Never true    Ran Out of Food in the Last Year: Never true  Transportation Needs: No Transportation Needs (01/21/2023)   PRAPARE - Administrator, Civil Service (Medical): No    Lack of Transportation (Non-Medical): No  Physical Activity: Sufficiently Active (01/21/2023)   Exercise Vital Sign    Days of Exercise per Week: 6 days    Minutes of Exercise per Session: 60 min  Stress: No Stress Concern  Present (01/21/2023)   [employer] of Occupational Health - Occupational Stress Questionnaire    Feeling of Stress : Not at all  Social Connections: Socially Integrated (01/21/2023)   Social Connection and Isolation Panel [NHANES]    Frequency of Communication with Friends and Family: More than three times a week    Frequency of Social Gatherings with Friends and Family: Twice a week    Attends Religious Services: 1 to 4 times per year    Active Member of [employer] or Organizations: No    Attends Engineer, structural: More than 4 times per year    Marital Status: Married  Catering manager Violence: Not At Risk (09/03/2022)   Humiliation, Afraid, Rape, and Kick questionnaire    Fear of Current or Ex-Partner: No    Emotionally Abused: No    Physically Abused: No    Sexually Abused: No    Family History  Problem Relation Age of Onset   Cancer Mother    Hypertension Mother    Hyperlipidemia Mother    Cancer Father        Lung,esophageal   Esophageal cancer Father    Heart attack Maternal Grandfather    Heart attack Paternal Grandfather    Colon cancer Neg Hx    Colon polyps Neg Hx    Rectal cancer Neg Hx    Stomach cancer Neg Hx    Pancreatic cancer Neg Hx     Current Outpatient Medications on File Prior to Visit  Medication Sig Dispense Refill   CALCIUM  CITRATE PO Take by mouth.     Elderberry 575 MG/5ML SYRP Take by mouth.     leptospermum manuka honey (MEDIHONEY) gel Apply 1 Application topically daily. 44 mL 2   Multiple Vitamins-Minerals (EMERGEN-C IMMUNE) PACK Take by mouth daily.     Probiotic Product (PROBIOTIC DAILY PO) Take by mouth.     valACYclovir  (VALTREX ) 1000 MG tablet Take 2 tablets (2,000 mg total) by mouth 2 (two) times daily. As needed 30 tablet 5   silver  sulfADIAZINE  (SILVADENE ) 1 % cream Apply 1 Application topically daily. (Patient not taking: Reported on 07/02/2023) 50 g 0   No current facility-administered medications on file prior to visit.     No Known Allergies     Physical Exam Vitals requested from patient and listed below if patient had equipment and was able to obtain at home for this virtual visit: There were no vitals filed for this visit. Estimated body mass index is 23.6 kg/m as calculated from the following:   Height as of 05/22/23: 5\' 5"  (1.651 m).   Weight as of 05/22/23: 141 lb 12.8 oz (64.3 kg).  EKG (  optional): deferred due to virtual visit  GENERAL: alert, oriented, no acute distress detected, full vision exam deferred due to pandemic and/or virtual encounter   HEENT: atraumatic, conjunttiva clear, no obvious abnormalities on inspection of external nose and ears  NECK: normal movements of the head and neck  LUNGS: on inspection no signs of respiratory distress, breathing rate appears normal, no obvious gross SOB, gasping or wheezing  CV: no obvious cyanosis  MS: moves all visible extremities without noticeable abnormality  PSYCH/NEURO: pleasant and cooperative, no obvious depression or anxiety, speech and thought processing grossly intact, Cognitive function grossly intact  Flowsheet Row Clinical Support from 07/02/2023 in [facility] [place]  PHQ-9 Total Score 0           07/02/2023   10:28 AM 02/25/2023   11:45 AM 01/07/2023   10:48 AM 09/03/2022    9:01 AM 07/25/2022   10:06 AM  Depression screen PHQ 2/9  Decreased Interest 0 0 0 0 0  Down, Depressed, Hopeless 0 0 0 0 0  PHQ - 2 Score 0 0 0 0 0  Altered sleeping 0 0  0   Tired, decreased energy 0 0  0   Change in appetite 0 0  0   Feeling bad or failure about yourself  0 0  0   Trouble concentrating 0 0  0   Moving slowly or fidgety/restless 0 0  0   Suicidal thoughts 0 0  0   PHQ-9 Score 0 0  0   Difficult doing work/chores  Not difficult at all  Not difficult at all        01/07/2023   10:48 AM 01/21/2023    9:18 PM 02/25/2023   11:45 AM 07/01/2023   11:11 AM 07/02/2023   10:28 AM  Fall Risk  Falls in the  past year? 0 0 0 0 0  Was there an injury with Fall? 0  0 0 0  Fall Risk Category Calculator 0  0 0  0  Patient at Risk for Falls Due to No Fall Risks  No Fall Risks  No Fall Risks  Fall risk Follow up Falls evaluation completed  Falls evaluation completed  Falls evaluation completed     Patient-reported     SUMMARY AND PLAN:  Encounter for Medicare annual wellness exam   Discussed applicable health maintenance/preventive health measures and advised and referred or ordered per patient preferences: -dexa is schedule with her gynecologist in July; asked that she bring a copy once done -discussed vaccines due recs/risks, asked that she send us  receipt if done  Health Maintenance  Topic Date Due   COVID-19 Vaccine (1) Never done   DTaP/Tdap/Td (1 - Tdap) Never done   Zoster Vaccines- Shingrix (1 of 2) Never done   DEXA SCAN  Never done   Pneumonia Vaccine 40+ Years old (1 of 2 - PCV) 09/27/2023 (Originally 07/29/1974)   INFLUENZA VACCINE  08/30/2023   Medicare Annual Wellness (AWV)  07/01/2024   MAMMOGRAM  07/23/2024   Colonoscopy  11/23/2027   Hepatitis C Screening  Completed   HPV VACCINES  Aged Out   Meningococcal B Vaccine  Aged Out      Education and counseling on the following was provided based on the above review of health and a plan/checklist for the patient, along with additional information discussed, was provided for the patient in the patient instructions :  -Advised and counseled on a healthy lifestyle - including the importance  of a healthy diet, regular physical activity, social connections and stress management. -Reviewed patient's current diet. Advised and counseled on a whole foods based healthy diet. Congratulated on a healthy diet. A summary of a healthy diet was provided in the Patient Instructions.  -reviewed patient's current physical activity level and discussed exercise guidelines for adults. Congratulated on healthy habits and encouraged to continued.   Provided community resources and ideas for safe exercise at home in patient instructions.   -Advise yearly dental visits at minimum and regular eye exams -Advised and counseled on alcohol safe limits, risks  Follow up: see patient instructions     Patient Instructions  I really enjoyed getting to talk with you today! I am available on Tuesdays and Thursdays for virtual visits if you have any questions or concerns, or if I can be of any further assistance.   CHECKLIST FROM ANNUAL WELLNESS VISIT:  -Follow up (please call to schedule if not scheduled after visit):   -yearly for annual wellness visit with primary care office  Here is a list of your preventive care/health maintenance measures and the plan for each if any are due:  PLAN For any measures below that may be due:    1. Please send us  a copy of your bone density report once done.   2. Please provide copy of receipt if you do the vaccines. Thanks!  Health Maintenance  Topic Date Due   COVID-19 Vaccine (1) Never done   DTaP/Tdap/Td (1 - Tdap) Never done   Zoster Vaccines- Shingrix (1 of 2) Never done   DEXA SCAN  Never done   Pneumonia Vaccine 90+ Years old (1 of 2 - PCV) 09/27/2023 (Originally 07/29/1974)   INFLUENZA VACCINE  08/30/2023   Medicare Annual Wellness (AWV)  09/03/2023   MAMMOGRAM  07/23/2024   Colonoscopy  11/23/2027   Hepatitis C Screening  Completed   HPV VACCINES  Aged Out   Meningococcal B Vaccine  Aged Out    -See a dentist at least yearly  -Get your eyes checked and then per your eye specialist's recommendations  -Other issues addressed today:   -I have included below further information regarding a healthy whole foods based diet, physical activity guidelines for adults, stress management and opportunities for social connections. I hope you find this information useful.    -----------------------------------------------------------------------------------------------------------------------------------------------------------------------------------------------------------------------------------------------------------    NUTRITION: -eat real food: lots of colorful vegetables (half the plate) and fruits -5-7 servings of vegetables and fruits per day (fresh or steamed is best), exp. 2 servings of vegetables with lunch and dinner and 2 servings of fruit per day. Berries and greens such as kale and collards are great choices.  -consume on a regular basis:  fresh fruits, fresh veggies, fish, nuts, seeds, healthy oils (such as olive oil, avocado oil), whole grains (make sure for bread/pasta/crackers/etc., that the first ingredient on label contains the word "whole"), legumes. -can eat small amounts of dairy and lean meat (no larger than the palm of your hand), but avoid processed meats such as ham, bacon, lunch meat, etc. -drink water -try to avoid fast food and pre-packaged foods, processed meat, ultra processed foods/beverages (donuts, candy, etc.) -most experts advise limiting sodium to < 2300mg  per day, should limit further is any chronic conditions such as high blood pressure, heart disease, diabetes, etc. The American Heart Association advised that < 1500mg  is is ideal -try to avoid foods/beverages that contain any ingredients with names you do not recognize  -try to avoid foods/beverages  with  added sugar or sweeteners/sweets  -try to avoid sweet drinks (including diet drinks): soda, juice, Gatorade, sweet tea, power drinks, diet drinks -try to avoid white rice, white bread, pasta (unless whole grain)  EXERCISE GUIDELINES FOR ADULTS: -if you wish to increase your physical activity, do so gradually and with the approval of your doctor -STOP and seek medical care immediately if you have any chest pain, chest discomfort or trouble breathing when starting or  increasing exercise  -move and stretch your body, legs, feet and arms when sitting for long periods -Physical activity guidelines for optimal health in adults: -get at least 150 minutes per week of moderate exercise (can talk, but not sing); this is about 20-30 minutes of sustained activity 5-7 days per week or two 10-15 minute episodes of sustained activity 5-7 days per week -do some muscle building/resistance training/strength training at least 2 days per week  -balance exercises 3+ days per week:   Stand somewhere where you have something sturdy to hold onto if you lose balance    1) lift up on toes, then back down, start with 5x per day and work up to 20x   2) stand and lift one leg straight out to the side so that foot is a few inches of the floor, start with 5x each side and work up to 20x each side   3) stand on one foot, start with 5 seconds each side and work up to 20 seconds on each side  If you need ideas or help with getting more active:  -Silver  sneakers https://tools.silversneakers.com  -Walk with a Doc: http://www.duncan-williams.com/  -try to include resistance (weight lifting/strength building) and balance exercises twice per week: or the following link for ideas: http://castillo-powell.com/  BuyDucts.dk  STRESS MANAGEMENT: -can try meditating, or just sitting quietly with deep breathing while intentionally relaxing all parts of your body for 5 minutes daily -if you need further help with stress, anxiety or depression please follow up with your primary doctor or contact the wonderful folks at [facility]: [phone]  SOCIAL CONNECTIONS: -options in [place] if you wish to engage in more social and exercise related activities:  -Silver  sneakers https://tools.silversneakers.com  -Walk with a Doc: http://www.duncan-williams.com/  -Check out the [facility] Active Adults 50+  section on the [place] of [employer] (hiking clubs, book clubs, cards and games, chess, exercise classes, aquatic classes and much more) - see the website for details: https://www.[place].gov/departments/parks-recreation/active-adults50  -YouTube has lots of exercise videos for different ages and abilities as well  -[facility] (a variety of indoor and outdoor inperson activities for adults). (810) 101-9078. [address].  -Virtual Online Classes (a variety of topics): see seniorplanet.org or call (321)354-4327  -consider volunteering at a school, hospice center, church, senior center or elsewhere            Maurie Southern, DO

## 2023-07-02 NOTE — Patient Instructions (Addendum)
 I really enjoyed getting to talk with you today! I am available on Tuesdays and Thursdays for virtual visits if you have any questions or concerns, or if I can be of any further assistance.   CHECKLIST FROM ANNUAL WELLNESS VISIT:  -Follow up (please call to schedule if not scheduled after visit):   -yearly for annual wellness visit with primary care office  Here is a list of your preventive care/health maintenance measures and the plan for each if any are due:  PLAN For any measures below that may be due:    1. Please send us  a copy of your bone density report once done.   2. Please provide copy of receipt if you do the vaccines. Thanks!  Health Maintenance  Topic Date Due   COVID-19 Vaccine (1) Never done   DTaP/Tdap/Td (1 - Tdap) Never done   Zoster Vaccines- Shingrix (1 of 2) Never done   DEXA SCAN  Never done   Pneumonia Vaccine 45+ Years old (1 of 2 - PCV) 09/27/2023 (Originally 07/29/1974)   INFLUENZA VACCINE  08/30/2023   Medicare Annual Wellness (AWV)  09/03/2023   MAMMOGRAM  07/23/2024   Colonoscopy  11/23/2027   Hepatitis C Screening  Completed   HPV VACCINES  Aged Out   Meningococcal B Vaccine  Aged Out    -See a dentist at least yearly  -Get your eyes checked and then per your eye specialist's recommendations  -Other issues addressed today:   -I have included below further information regarding a healthy whole foods based diet, physical activity guidelines for adults, stress management and opportunities for social connections. I hope you find this information useful.   -----------------------------------------------------------------------------------------------------------------------------------------------------------------------------------------------------------------------------------------------------------    NUTRITION: -eat real food: lots of colorful vegetables (half the plate) and fruits -5-7 servings of vegetables and fruits per day (fresh or  steamed is best), exp. 2 servings of vegetables with lunch and dinner and 2 servings of fruit per day. Berries and greens such as kale and collards are great choices.  -consume on a regular basis:  fresh fruits, fresh veggies, fish, nuts, seeds, healthy oils (such as olive oil, avocado oil), whole grains (make sure for bread/pasta/crackers/etc., that the first ingredient on label contains the word "whole"), legumes. -can eat small amounts of dairy and lean meat (no larger than the palm of your hand), but avoid processed meats such as ham, bacon, lunch meat, etc. -drink water -try to avoid fast food and pre-packaged foods, processed meat, ultra processed foods/beverages (donuts, candy, etc.) -most experts advise limiting sodium to < 2300mg  per day, should limit further is any chronic conditions such as high blood pressure, heart disease, diabetes, etc. The American Heart Association advised that < 1500mg  is is ideal -try to avoid foods/beverages that contain any ingredients with names you do not recognize  -try to avoid foods/beverages  with added sugar or sweeteners/sweets  -try to avoid sweet drinks (including diet drinks): soda, juice, Gatorade, sweet tea, power drinks, diet drinks -try to avoid white rice, white bread, pasta (unless whole grain)  EXERCISE GUIDELINES FOR ADULTS: -if you wish to increase your physical activity, do so gradually and with the approval of your doctor -STOP and seek medical care immediately if you have any chest pain, chest discomfort or trouble breathing when starting or increasing exercise  -move and stretch your body, legs, feet and arms when sitting for long periods -Physical activity guidelines for optimal health in adults: -get at least 150 minutes per week of moderate exercise (can  talk, but not sing); this is about 20-30 minutes of sustained activity 5-7 days per week or two 10-15 minute episodes of sustained activity 5-7 days per week -do some muscle  building/resistance training/strength training at least 2 days per week  -balance exercises 3+ days per week:   Stand somewhere where you have something sturdy to hold onto if you lose balance    1) lift up on toes, then back down, start with 5x per day and work up to 20x   2) stand and lift one leg straight out to the side so that foot is a few inches of the floor, start with 5x each side and work up to 20x each side   3) stand on one foot, start with 5 seconds each side and work up to 20 seconds on each side  If you need ideas or help with getting more active:  -Silver  sneakers https://tools.silversneakers.com  -Walk with a Doc: http://www.duncan-williams.com/  -try to include resistance (weight lifting/strength building) and balance exercises twice per week: or the following link for ideas: http://castillo-powell.com/  BuyDucts.dk  STRESS MANAGEMENT: -can try meditating, or just sitting quietly with deep breathing while intentionally relaxing all parts of your body for 5 minutes daily -if you need further help with stress, anxiety or depression please follow up with your primary doctor or contact the wonderful folks at [facility]: [phone]  SOCIAL CONNECTIONS: -options in [place] if you wish to engage in more social and exercise related activities:  -Silver  sneakers https://tools.silversneakers.com  -Walk with a Doc: http://www.duncan-williams.com/  -Check out the [facility] Active Adults 50+ section on the [place] of [employer] (hiking clubs, book clubs, cards and games, chess, exercise classes, aquatic classes and much more) - see the website for details: https://www.[place].gov/departments/parks-recreation/active-adults50  -YouTube has lots of exercise videos for different ages and abilities as well  -[facility] (a variety of indoor and outdoor  inperson activities for adults). [phone]. [address].  -Virtual Online Classes (a variety of topics): see seniorplanet.org or call [phone]  -consider volunteering at a school, hospice center, church, senior center or elsewhere

## 2023-07-11 ENCOUNTER — Ambulatory Visit: Payer: Medicare Other | Admitting: Urology

## 2023-08-12 DIAGNOSIS — K080 Exfoliation of teeth due to systemic causes: Secondary | ICD-10-CM | POA: Diagnosis not present

## 2023-08-28 DIAGNOSIS — M8588 Other specified disorders of bone density and structure, other site: Secondary | ICD-10-CM | POA: Diagnosis not present

## 2023-08-28 DIAGNOSIS — Z01419 Encounter for gynecological examination (general) (routine) without abnormal findings: Secondary | ICD-10-CM | POA: Diagnosis not present

## 2023-08-28 DIAGNOSIS — Z6823 Body mass index (BMI) 23.0-23.9, adult: Secondary | ICD-10-CM | POA: Diagnosis not present

## 2023-08-29 ENCOUNTER — Other Ambulatory Visit: Payer: Self-pay | Admitting: Obstetrics and Gynecology

## 2023-08-29 DIAGNOSIS — Z1231 Encounter for screening mammogram for malignant neoplasm of breast: Secondary | ICD-10-CM

## 2023-09-11 NOTE — Progress Notes (Signed)
 Chief Complaint  Patient presents with   Extremity Weakness    Pt c/o achy sx on arms and legs going on 6 wks.   Hemorrhoids    Pt reports she is not sure if its hemorrhoid. Bowel movement change and stool change. On cipro  and antibiotic. Been going on since march.    HPI: Bridget Elliott 68 y.o. come in for  above problem .  Bowel habits changed after  antibiotic in spring  cipro  and 2 others for  burn  and uti . Change in habits   looser  mushy  not more frequent .  Lot more mucous .   Not  nocturnal   night .  One time  had blood felt from outside  BR   ( has a hemorrhoid)  Intermittent  rectal pressure  Last colon 7 years   had hemorrrhoid . Internal  no abd cramps fever .  Achy sx recently and claves and arms achy.  Worse in am but  better in day  acts like the flu but no incitors  rash  med change  weight loss  not sweat.   Has had ROS: See pertinent positives and negatives per HPI.  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   Qualifier: History of  By: Nickola CMA, [place]     Hx of varicella    Renal stone    hx of removal and lithotrypsy    Scarlet fever 03/21/2007   Qualifier: History of  By: Nickola CMA, [place]     UTI (urinary tract infection) 10/22/2011    Family History  Problem Relation Age of Onset   Cancer Mother    Hypertension Mother    Hyperlipidemia Mother    Cancer Father        Lung,esophageal   Esophageal cancer Father    Heart attack Maternal Grandfather    Heart attack Paternal Grandfather    Colon cancer Neg Hx    Colon polyps Neg Hx    Rectal cancer Neg Hx    Stomach cancer Neg Hx    Pancreatic cancer Neg Hx     Social History   Socioeconomic History   Marital status: Married    Spouse name: Not on file   Number of children: Not on file   Years of education: Not on file   Highest education level: Bachelor's degree (e.g., BA, AB, BS)  Occupational History   Not on file  Tobacco Use   Smoking status: Never    Smokeless tobacco: Never  Vaping Use   Vaping status: Never Used  Substance and Sexual Activity   Alcohol use: Yes    Alcohol/week: 0.0 standard drinks of alcohol    Comment: 5 glasses of wine a week   Drug use: No   Sexual activity: Yes    Partners: Male  Other Topics Concern   Not on file  Social History Narrative   7 hours of sleep per night   Does not work outside the home   Lives alone with her husband   2 Cats in the home   G4P4   LIFESTYLE:    Exercise:     Tobacco/ETS:no   Alcohol: per day  0 - 5 per week    Sugar beverages:   Sleep: 7 hours    Drug use: no   Social Drivers of Corporate investment banker Strain: Low Risk  (01/21/2023)   Overall Financial Resource Strain (CARDIA)  Difficulty of Paying Living Expenses: Not hard at all  Food Insecurity: No Food Insecurity (09/11/2023)   Hunger Vital Sign    Worried About Running Out of Food in the Last Year: Never true    Ran Out of Food in the Last Year: Never true  Transportation Needs: No Transportation Needs (09/11/2023)   PRAPARE - Administrator, Civil Service (Medical): No    Lack of Transportation (Non-Medical): No  Physical Activity: Sufficiently Active (09/11/2023)   Exercise Vital Sign    Days of Exercise per Week: 6 days    Minutes of Exercise per Session: 60 min  Stress: No Stress Concern Present (09/11/2023)   [employer] of Occupational Health - Occupational Stress Questionnaire    Feeling of Stress: Not at all  Social Connections: Moderately Integrated (09/11/2023)   Social Connection and Isolation Panel    Frequency of Communication with Friends and Family: More than three times a week    Frequency of Social Gatherings with Friends and Family: Twice a week    Attends Religious Services: 1 to 4 times per year    Active Member of [employer] or Organizations: No    Attends Engineer, structural: Not on file    Marital Status: Married    Outpatient Medications Prior to Visit   Medication Sig Dispense Refill   CALCIUM  CITRATE PO Take by mouth.     Elderberry 575 MG/5ML SYRP Take by mouth.     Multiple Vitamins-Minerals (EMERGEN-C IMMUNE) PACK Take by mouth daily.     Probiotic Product (PROBIOTIC DAILY PO) Take by mouth.     valACYclovir  (VALTREX ) 1000 MG tablet Take 2 tablets (2,000 mg total) by mouth 2 (two) times daily. As needed 30 tablet 5   leptospermum manuka honey (MEDIHONEY) gel Apply 1 Application topically daily. 44 mL 2   silver  sulfADIAZINE  (SILVADENE ) 1 % cream Apply 1 Application topically daily. (Patient not taking: Reported on 07/02/2023) 50 g 0   No facility-administered medications prior to visit.     EXAM:  BP 116/80 (BP Location: Left Arm, Patient Position: Sitting, Cuff Size: Normal)   Pulse 94   Temp 98.2 F (36.8 C) (Oral)   Ht 5' 5 (1.651 m)   Wt 137 lb 9.6 oz (62.4 kg)   SpO2 97%   BMI 22.90 kg/m   Body mass index is 22.9 kg/m. Wt Readings from Last 3 Encounters:  09/12/23 137 lb 9.6 oz (62.4 kg)  05/22/23 141 lb 12.8 oz (64.3 kg)  05/08/23 141 lb 1.6 oz (64 kg)   BP Readings from Last 3 Encounters:  09/12/23 116/80  05/22/23 130/80  05/08/23 130/76     GENERAL: vitals reviewed and listed above, alert, oriented, appears well hydrated and in no acute distress HEENT: atraumatic, conjunctiva  clear, no obvious abnormalities on inspection of external nose and ears OP : no lesion edema or exudate  NECK: no obvious masses on inspection palpation  LUNGS: clear to auscultation bilaterally, no wheezes, rales or rhonchi, good air movement Abdomen:  Sof,t normal bowel sounds without hepatosplenomegaly, no guarding rebound or masses no CVA tenderness SKin non icteric  CV: HRRR, no clubbing cyanosis or  peripheral edema nl cap refill  MS: moves all extremities without noticeable focal  abnormality PSYCH: pleasant and cooperative, no obvious depression or anxiety Lab Results  Component Value Date   WBC 6.0 09/12/2023   HGB 15.3  (H) 09/12/2023   HCT 45.5 09/12/2023   PLT 317.0 09/12/2023  GLUCOSE 97 09/12/2023   CHOL 256 (H) 09/12/2023   TRIG 126.0 09/12/2023   HDL 70.40 09/12/2023   LDLDIRECT 146.9 09/20/2009   LDLCALC 161 (H) 09/12/2023   ALT 18 09/12/2023   AST 19 09/12/2023   NA 141 09/12/2023   K 4.8 09/12/2023   CL 103 09/12/2023   CREATININE 0.75 09/12/2023   BUN 20 09/12/2023   CO2 31 09/12/2023   TSH 1.88 09/12/2023   HGBA1C 6.0 09/12/2023   BP Readings from Last 3 Encounters:  09/12/23 116/80  05/22/23 130/80  05/08/23 130/76    ASSESSMENT AND PLAN:  Discussed the following assessment and plan:  Change in bowel movement - Plan: Basic metabolic panel with GFR, CBC with Differential/Platelet, Hemoglobin A1c, Hepatic function panel, TSH, T4, free, C-reactive protein, Sedimentation rate, Lipid panel, CK, ANA, Ambulatory referral to Gastroenterology  Myalgia - Plan: Basic metabolic panel with GFR, CBC with Differential/Platelet, Hemoglobin A1c, Hepatic function panel, TSH, T4, free, C-reactive protein, Sedimentation rate, Lipid panel, CK, ANA  Elevated LDL cholesterol level - Plan: Basic metabolic panel with GFR, CBC with Differential/Platelet, Hemoglobin A1c, Hepatic function panel, TSH, T4, free, C-reactive protein, Sedimentation rate, Lipid panel, CK, ANA  Elevated coronary artery calcium  score - Plan: Basic metabolic panel with GFR, CBC with Differential/Platelet, Hemoglobin A1c, Hepatic function panel, TSH, T4, free, C-reactive protein, Sedimentation rate, Lipid panel, CK, ANA Change in bowels  began ? After antibiotics. Not alarming otherwise  but persistent and different for her.  Myalgias came much later  later  consider inflammatory condition but seems non specific .   -Patient advised to return or notify health care team  if  new concerns arise.  Patient Instructions  Update labs today . GI Will do gi referral  Acts like post antibiotic  effect but lasting  too long.  Exam   reassuring.     Jullie Arps K. Lyam Provencio M.D.

## 2023-09-12 ENCOUNTER — Encounter: Payer: Self-pay | Admitting: Internal Medicine

## 2023-09-12 ENCOUNTER — Ambulatory Visit (INDEPENDENT_AMBULATORY_CARE_PROVIDER_SITE_OTHER): Admitting: Internal Medicine

## 2023-09-12 ENCOUNTER — Ambulatory Visit
Admission: RE | Admit: 2023-09-12 | Discharge: 2023-09-12 | Disposition: A | Discharge status: Home / Self Care | Source: Ambulatory Visit | Attending: Obstetrics and Gynecology | Admitting: Obstetrics and Gynecology

## 2023-09-12 VITALS — BP 116/80 | HR 94 | Temp 98.2°F | Ht 65.0 in | Wt 137.6 lb

## 2023-09-12 DIAGNOSIS — R931 Abnormal findings on diagnostic imaging of heart and coronary circulation: Secondary | ICD-10-CM

## 2023-09-12 DIAGNOSIS — R198 Other specified symptoms and signs involving the digestive system and abdomen: Secondary | ICD-10-CM

## 2023-09-12 DIAGNOSIS — E7800 Pure hypercholesterolemia, unspecified: Secondary | ICD-10-CM

## 2023-09-12 DIAGNOSIS — M791 Myalgia, unspecified site: Secondary | ICD-10-CM

## 2023-09-12 DIAGNOSIS — Z1231 Encounter for screening mammogram for malignant neoplasm of breast: Secondary | ICD-10-CM

## 2023-09-12 LAB — CBC WITH DIFFERENTIAL/PLATELET
Basophils Absolute: 0.1 K/uL (ref 0.0–0.1)
Basophils Relative: 0.9 % (ref 0.0–3.0)
Eosinophils Absolute: 0.2 K/uL (ref 0.0–0.7)
Eosinophils Relative: 2.7 % (ref 0.0–5.0)
HCT: 45.5 % (ref 36.0–46.0)
Hemoglobin: 15.3 g/dL — ABNORMAL HIGH (ref 12.0–15.0)
Lymphocytes Relative: 31.2 % (ref 12.0–46.0)
Lymphs Abs: 1.9 K/uL (ref 0.7–4.0)
MCHC: 33.5 g/dL (ref 30.0–36.0)
MCV: 91.4 fl (ref 78.0–100.0)
Monocytes Absolute: 0.6 K/uL (ref 0.1–1.0)
Monocytes Relative: 9.5 % (ref 3.0–12.0)
Neutro Abs: 3.3 K/uL (ref 1.4–7.7)
Neutrophils Relative %: 55.7 % (ref 43.0–77.0)
Platelets: 317 K/uL (ref 150.0–400.0)
RBC: 4.98 Mil/uL (ref 3.87–5.11)
RDW: 14.2 % (ref 11.5–15.5)
WBC: 6 K/uL (ref 4.0–10.5)

## 2023-09-12 LAB — HEPATIC FUNCTION PANEL
ALT: 18 U/L (ref 0–35)
AST: 19 U/L (ref 0–37)
Albumin: 4.7 g/dL (ref 3.5–5.2)
Alkaline Phosphatase: 85 U/L (ref 39–117)
Bilirubin, Direct: 0.1 mg/dL (ref 0.0–0.3)
Total Bilirubin: 0.7 mg/dL (ref 0.2–1.2)
Total Protein: 7.5 g/dL (ref 6.0–8.3)

## 2023-09-12 LAB — BASIC METABOLIC PANEL WITH GFR
BUN: 20 mg/dL (ref 6–23)
CO2: 31 meq/L (ref 19–32)
Calcium: 10 mg/dL (ref 8.4–10.5)
Chloride: 103 meq/L (ref 96–112)
Creatinine, Ser: 0.75 mg/dL (ref 0.40–1.20)
GFR: 82.55 mL/min (ref >60.00)
Glucose, Bld: 97 mg/dL (ref 70–99)
Potassium: 4.8 meq/L (ref 3.5–5.1)
Sodium: 141 meq/L (ref 135–145)

## 2023-09-12 LAB — LIPID PANEL
Cholesterol: 256 mg/dL — ABNORMAL HIGH (ref 0–200)
HDL: 70.4 mg/dL (ref >39.00)
LDL Cholesterol: 161 mg/dL — ABNORMAL HIGH (ref 0–99)
NonHDL: 185.81
Total CHOL/HDL Ratio: 4
Triglycerides: 126 mg/dL (ref 0.0–149.0)
VLDL: 25.2 mg/dL (ref 0.0–40.0)

## 2023-09-12 LAB — CK: Total CK: 46 U/L (ref 17–177)

## 2023-09-12 LAB — SEDIMENTATION RATE: Sed Rate: 3 mm/h (ref 0–30)

## 2023-09-12 LAB — HM MAMMOGRAPHY

## 2023-09-12 LAB — HEMOGLOBIN A1C: Hgb A1c MFr Bld: 6 % (ref 4.6–6.5)

## 2023-09-12 LAB — TSH: TSH: 1.88 u[IU]/mL (ref 0.35–5.50)

## 2023-09-12 LAB — T4, FREE: Free T4: 0.8 ng/dL (ref 0.60–1.60)

## 2023-09-12 LAB — C-REACTIVE PROTEIN: CRP: <1 mg/dL (ref 0.5–20.0)

## 2023-09-12 NOTE — Patient Instructions (Signed)
 Update labs today . GI Will do gi referral  Acts like post antibiotic  effect but lasting  too long.  Exam  reassuring.

## 2023-09-14 LAB — ANA: Anti Nuclear Antibody (ANA): NEGATIVE

## 2023-09-18 ENCOUNTER — Ambulatory Visit: Payer: Self-pay | Admitting: Internal Medicine

## 2023-09-18 ENCOUNTER — Other Ambulatory Visit: Payer: Self-pay | Admitting: Internal Medicine

## 2023-09-18 DIAGNOSIS — E785 Hyperlipidemia, unspecified: Secondary | ICD-10-CM

## 2023-09-18 DIAGNOSIS — E7800 Pure hypercholesterolemia, unspecified: Secondary | ICD-10-CM

## 2023-09-18 NOTE — Progress Notes (Signed)
 No anemia , liver kidney function normal  and nl inflammation markers. Cholesterol level is up for you  .  Intensify lifestyle interventions. And  you can consider lipid lowering medication  Suggest  fu fasting lipid panel  in 6 months  to see where we are  orders are in record

## 2023-09-27 ENCOUNTER — Encounter: Payer: Self-pay | Admitting: Pediatrics

## 2023-10-01 NOTE — Progress Notes (Unsigned)
 No chief complaint on file.   HPI: Patient  Bridget Elliott  68 y.o. comes in today for Preventive Health Care visit   Health Maintenance  Topic Date Due   COVID-19 Vaccine (1) Never done   DTaP/Tdap/Td (1 - Tdap) Never done   Pneumococcal Vaccine: 50+ Years (1 of 2 - PCV) Never done   Zoster Vaccines- Shingrix (1 of 2) Never done   DEXA SCAN  Never done   INFLUENZA VACCINE  Never done   Medicare Annual Wellness (AWV)  07/01/2024   MAMMOGRAM  09/11/2025   Colonoscopy  11/23/2027   Hepatitis C Screening  Completed   HPV VACCINES  Aged Out   Meningococcal B Vaccine  Aged Out   Health Maintenance Review LIFESTYLE:  Exercise:   Tobacco/ETS: Alcohol:  Sugar beverages: Sleep: Drug use: no HH of  Work:    ROS:  GEN/ HEENT: No fever, significant weight changes sweats headaches vision problems hearing changes, CV/ PULM; No chest pain shortness of breath cough, syncope,edema  change in exercise tolerance. GI /GU: No adominal pain, vomiting, change in bowel habits. No blood in the stool. No significant GU symptoms. SKIN/HEME: ,no acute skin rashes suspicious lesions or bleeding. No lymphadenopathy, nodules, masses.  NEURO/ PSYCH:  No neurologic signs such as weakness numbness. No depression anxiety. IMM/ Allergy: No unusual infections.  Allergy .   REST of 12 system review negative except as per HPI   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   Qualifier: History of  By: Nickola CMA, [place]     Hx of varicella    Renal stone    hx of removal and lithotrypsy    Scarlet fever 03/21/2007   Qualifier: History of  By: Nickola CMA, [place]     UTI (urinary tract infection) 10/22/2011    Past Surgical History:  Procedure Laterality Date   COLONOSCOPY  1841   kidney stone removal     left calf surgery     x2     TONSILLECTOMY      Family History  Problem Relation Age of Onset   Cancer Mother    Hypertension Mother    Hyperlipidemia Mother     Cancer Father        Lung,esophageal   Esophageal cancer Father    Heart attack Maternal Grandfather    Heart attack Paternal Grandfather    Colon cancer Neg Hx    Colon polyps Neg Hx    Rectal cancer Neg Hx    Stomach cancer Neg Hx    Pancreatic cancer Neg Hx     Social History   Socioeconomic History   Marital status: Married    Spouse name: Not on file   Number of children: Not on file   Years of education: Not on file   Highest education level: Bachelor's degree (e.g., BA, AB, BS)  Occupational History   Not on file  Tobacco Use   Smoking status: Never   Smokeless tobacco: Never  Vaping Use   Vaping status: Never Used  Substance and Sexual Activity   Alcohol use: Yes    Alcohol/week: 0.0 standard drinks of alcohol    Comment: 5 glasses of wine a week   Drug use: No   Sexual activity: Yes    Partners: Male  Other Topics Concern   Not on file  Social History Narrative   7 hours of sleep per night   Does not work outside the home  Lives alone with her husband   2 Cats in the home   G4P4   LIFESTYLE:    Exercise:     Tobacco/ETS:no   Alcohol: per day  0 - 5 per week    Sugar beverages:   Sleep: 7 hours    Drug use: no   Social Drivers of Corporate investment banker Strain: Low Risk  (01/21/2023)   Overall Financial Resource Strain (CARDIA)    Difficulty of Paying Living Expenses: Not hard at all  Food Insecurity: No Food Insecurity (09/11/2023)   Hunger Vital Sign    Worried About Running Out of Food in the Last Year: Never true    Ran Out of Food in the Last Year: Never true  Transportation Needs: No Transportation Needs (09/11/2023)   PRAPARE - Administrator, Civil Service (Medical): No    Lack of Transportation (Non-Medical): No  Physical Activity: Sufficiently Active (09/11/2023)   Exercise Vital Sign    Days of Exercise per Week: 6 days    Minutes of Exercise per Session: 60 min  Stress: No Stress Concern Present (09/11/2023)   [employer] of Occupational Health - Occupational Stress Questionnaire    Feeling of Stress: Not at all  Social Connections: Moderately Integrated (09/11/2023)   Social Connection and Isolation Panel    Frequency of Communication with Friends and Family: More than three times a week    Frequency of Social Gatherings with Friends and Family: Twice a week    Attends Religious Services: 1 to 4 times per year    Active Member of [employer] or Organizations: No    Attends Engineer, structural: Not on file    Marital Status: Married    Outpatient Medications Prior to Visit  Medication Sig Dispense Refill   CALCIUM  CITRATE PO Take by mouth.     Elderberry 575 MG/5ML SYRP Take by mouth.     Multiple Vitamins-Minerals (EMERGEN-C IMMUNE) PACK Take by mouth daily.     Probiotic Product (PROBIOTIC DAILY PO) Take by mouth.     valACYclovir  (VALTREX ) 1000 MG tablet Take 2 tablets (2,000 mg total) by mouth 2 (two) times daily. As needed 30 tablet 5   No facility-administered medications prior to visit.     EXAM:  There were no vitals taken for this visit.  There is no height or weight on file to calculate BMI. Wt Readings from Last 3 Encounters:  09/12/23 137 lb 9.6 oz (62.4 kg)  05/22/23 141 lb 12.8 oz (64.3 kg)  05/08/23 141 lb 1.6 oz (64 kg)    Physical Exam: Vital signs reviewed HZW:Uypd is a well-developed well-nourished alert cooperative    who appearsr stated age in no acute distress.  HEENT: normocephalic atraumatic , Eyes: PERRL EOM's full, conjunctiva clear, Nares: paten,t no deformity discharge or tenderness., Ears: no deformity EAC's clear TMs with normal landmarks. Mouth: clear OP, no lesions, edema.  Moist mucous membranes. Dentition in adequate repair. NECK: supple without masses, thyromegaly or bruits. CHEST/PULM:  Clear to auscultation and percussion breath sounds equal no wheeze , rales or rhonchi. No chest wall deformities or tenderness. Breast: normal by inspection . No  dimpling, discharge, masses, tenderness or discharge . CV: PMI is nondisplaced, S1 S2 no gallops, murmurs, rubs. Peripheral pulses are full without delay.No JVD .  ABDOMEN: Bowel sounds normal nontender  No guard or rebound, no hepato splenomegal no CVA tenderness.  No hernia. Extremtities:  No clubbing cyanosis or edema, no  acute joint swelling or redness no focal atrophy NEURO:  Oriented x3, cranial nerves 3-12 appear to be intact, no obvious focal weakness,gait within normal limits no abnormal reflexes or asymmetrical SKIN: No acute rashes normal turgor, color, no bruising or petechiae. PSYCH: Oriented, good eye contact, no obvious depression anxiety, cognition and judgment appear normal. LN: no cervical axillary adenopathy  Lab Results  Component Value Date   WBC 6.0 09/12/2023   HGB 15.3 (H) 09/12/2023   HCT 45.5 09/12/2023   PLT 317.0 09/12/2023   GLUCOSE 97 09/12/2023   CHOL 256 (H) 09/12/2023   TRIG 126.0 09/12/2023   HDL 70.40 09/12/2023   LDLDIRECT 146.9 09/20/2009   LDLCALC 161 (H) 09/12/2023   ALT 18 09/12/2023   AST 19 09/12/2023   NA 141 09/12/2023   K 4.8 09/12/2023   CL 103 09/12/2023   CREATININE 0.75 09/12/2023   BUN 20 09/12/2023   CO2 31 09/12/2023   TSH 1.88 09/12/2023   HGBA1C 6.0 09/12/2023    BP Readings from Last 3 Encounters:  09/12/23 116/80  05/22/23 130/80  05/08/23 130/76   Jan 2025 Coronary calcium  score of 60.8 Agatston units. This was 57th percentile for age-, race-, and sex-matched controls. Lab results reviewed with patient   ASSESSMENT AND PLAN:  Discussed the following assessment and plan:    ICD-10-CM   1. Visit for preventive health examination  Z00.00     2. Medication management  Z79.899      No follow-ups on file.  Patient Care Team: Rayli Wiederhold, Apolinar POUR, MD as PCP - General (Internal Medicine) Lonni Slain, MD as PCP - Cardiology (Cardiology) Johnnye Ade, MD as Consulting Physician (Obstetrics and  Gynecology) [facility] (Ophthalmology) Robinson Pao, MD as Consulting Physician (Dermatology) There are no Patient Instructions on file for this visit.  Jose Alleyne K. Alithia Zavaleta M.D.

## 2023-10-02 ENCOUNTER — Ambulatory Visit (INDEPENDENT_AMBULATORY_CARE_PROVIDER_SITE_OTHER): Admitting: Internal Medicine

## 2023-10-02 ENCOUNTER — Encounter: Payer: Self-pay | Admitting: Internal Medicine

## 2023-10-02 VITALS — BP 104/70 | HR 90 | Temp 98.2°F | Ht 64.2 in | Wt 139.2 lb

## 2023-10-02 DIAGNOSIS — Z0000 Encounter for general adult medical examination without abnormal findings: Secondary | ICD-10-CM

## 2023-10-02 DIAGNOSIS — Z79899 Other long term (current) drug therapy: Secondary | ICD-10-CM

## 2023-10-02 MED ORDER — VALACYCLOVIR HCL 1 G PO TABS: 2000.0000 mg | ORAL_TABLET | Freq: Two times a day (BID) | ORAL | 5 refills | Status: AC

## 2023-10-02 NOTE — Patient Instructions (Signed)
 Good to see you today  Flu vaccine before end of October advised.   Continue optimize lifestyle as planned  The 10-year ASCVD risk score (Arnett DK, et al., 2019) is: 4.8%   Values used to calculate the score:     Age: 68 years     Clincally relevant sex: Female     Is Non-Hispanic African American: No     Diabetic: No     Tobacco smoker: No     Systolic Blood Pressure: 104 mmHg     Is BP treated: No     HDL Cholesterol: 70.4 mg/dL     Total Cholesterol: 256 mg/dL

## 2023-10-16 ENCOUNTER — Encounter: Payer: Self-pay | Admitting: Internal Medicine

## 2023-11-05 ENCOUNTER — Ambulatory Visit (HOSPITAL_BASED_OUTPATIENT_CLINIC_OR_DEPARTMENT_OTHER)
Admission: RE | Admit: 2023-11-05 | Discharge: 2023-11-05 | Disposition: A | Discharge status: Home / Self Care | Source: Ambulatory Visit | Attending: Internal Medicine | Admitting: Internal Medicine

## 2023-11-05 ENCOUNTER — Ambulatory Visit: Payer: Self-pay | Admitting: Internal Medicine

## 2023-11-05 DIAGNOSIS — K7689 Other specified diseases of liver: Secondary | ICD-10-CM | POA: Insufficient documentation

## 2023-11-05 NOTE — Progress Notes (Signed)
 Ultrasound is stable  liver  abnormalities

## 2023-11-18 NOTE — Progress Notes (Signed)
 " [facility] Initial Consultation   Referring Provider Panosh, Apolinar POUR, MD [address] [place],  [place] [address]  Primary Care Provider Panosh, Apolinar POUR, MD  Patient Profile: Bridget Elliott is a 68 y.o. female who is seen in consultation in the [facility] at the request of Dr. Charlett for evaluation and management of the problem(s) noted below.  Problem List: Change in bowel habits-mucus in stool Liver hemangiomas and cyst Gallbladder polyp  History of Present Illness     Discussed the use of AI scribe software for clinical note transcription with the patient, who gave verbal consent to proceed.  History of Present Illness Bridget Elliott is a 68 year old female with a past medical history noteworthy for anemia, nephrolithiasis, UTI who was referred to the gastroenterology office for evaluation of change in bowel habits -mucus in stool, history of liver hemangioma and cyst as well as gallbladder polyp   Altered bowel habits and mucus in stool - Reports frequent antibiotic use over the last year for UTI as well as a skin burn -possible disruption to gut microbiome - Since July 2025, changes in bowel movements with the appearance of white mucus after multiple courses of antibiotics - Initially, mucus present with every bowel movement; currently intermittent, occurring two to three times per week - Bowel movement frequency remains daily in the morning with some fluctuation in consistency - No diarrhea or significant gastrointestinal symptoms following antibiotic use - Occasional rectal bleeding/blood blood on wiping attributed to an external hemorrhoid - Intermittent pressure in the anorectal area - No significant abdominal pain  - Long-term probiotic use without changes - Denies dietary modifications - Diet includes oatmeal and prunes - Takes a multivitamin, calcium  supplement, and immune boosters as needed  - Last colonoscopy performed 2019  -lymphoid aggregate-next due 2029  Liver hemangiomas and cysts - Has undergone repeated ultrasound and CT monitoring for liver lesions characterize as hemangiomas and a complex cyst - Last RUQ ultrasound 10/2023 confirm stability of angiomas and complex cyst -no changes since 2023 - Reports that her father had a form of liver cyst and underwent surgical resection in [place]  in remote past - Otherwise, no family history of significant liver disease or malignancy  Gallbladder polyp - Ultrasound imaging has shown evidence of a diminutive gallbladder polyp measured 3 to 4 mm in 2023 and 2025 - Reviewed the size and stability suggest that this is benign -guidelines would advise a 5-year follow-up ultrasound in 2028  Colorectal cancer screening - Up-to-date for colorectal cancer screening -documentation of prior colonoscopies under review of data - No history of colon polyps - No family history of colorectal cancer   GI Review of Symptoms Significant for mucus in stool. Otherwise negative.  General Review of Systems  Review of systems is significant for the pertinent positives and negatives as listed per the HPI.  Full ROS is otherwise negative.  Past Medical History   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   Qualifier: History of  By: Nickola CMA, [place]     Hx of varicella    Renal stone    hx of removal and lithotrypsy    Scarlet fever 03/21/2007   Qualifier: History of  By: Nickola CMA, [place]     UTI (urinary tract infection) 10/22/2011     Past Surgical History   Past Surgical History:  Procedure Laterality Date   COLONOSCOPY  1841   kidney stone removal     left calf  surgery     x2     TONSILLECTOMY       Allergies and Medications   No Known Allergies   Current Meds  Medication Sig   CALCIUM  CITRATE PO Take by mouth.   Elderberry 575 MG/5ML SYRP Take by mouth.   Multiple Vitamins-Minerals (EMERGEN-C IMMUNE) PACK Take by mouth daily.    Omega-3 Fatty Acids (FISH OIL) 1000 MG CAPS 1 capsule.   Probiotic Product (PROBIOTIC DAILY PO) Take by mouth.   valACYclovir  (VALTREX ) 1000 MG tablet Take 2 tablets (2,000 mg total) by mouth 2 (two) times daily. As needed     Family History   Family History  Problem Relation Age of Onset   Cancer Mother    Hypertension Mother    Hyperlipidemia Mother    Cancer Father        Lung,esophageal   Esophageal cancer Father    Heart attack Maternal Grandfather    Heart attack Paternal Grandfather    Colon cancer Neg Hx    Colon polyps Neg Hx    Rectal cancer Neg Hx    Stomach cancer Neg Hx    Pancreatic cancer Neg Hx      Social History   Social History   Tobacco Use   Smoking status: Never   Smokeless tobacco: Never  Vaping Use   Vaping status: Never Used  Substance Use Topics   Alcohol use: Yes    Alcohol/week: 0.0 standard drinks of alcohol    Comment: 5 glasses of wine a week   Drug use: No   Franklin Clapsaddle reports that she has never smoked. She has never used smokeless tobacco. She reports current alcohol use. She reports that she does not use drugs.  Vital Signs and Physical Examination   Vitals:   11/19/23 0913  BP: 122/78  Pulse: 81   Body mass index is 23.32 kg/m. Weight: 140 lb 2 oz (63.6 kg)  General: Well developed, well nourished, no acute distress Head: Normocephalic and atraumatic Eyes: Sclerae anicteric, EOMI Lungs: Clear throughout to auscultation Heart: Regular rate and rhythm; No murmurs, rubs or bruits Abdomen: Soft, non tender and non distended. No masses, hepatosplenomegaly or hernias noted. Normal Bowel sounds Rectal: Deferred Musculoskeletal: Symmetrical with no gross deformities    Review of Data  The following data was reviewed at the time of this encounter:  Laboratory Studies      Latest Ref Rng & Units 09/12/2023    9:13 AM 02/11/2023   10:47 AM 09/27/2022   10:25 AM  CBC  WBC 4.0 - 10.5 K/uL 6.0  6.6  6.6   Hemoglobin 12.0 -  15.0 g/dL 84.6  84.9  84.4   Hematocrit 36.0 - 46.0 % 45.5  45.3  47.4   Platelets 150.0 - 400.0 K/uL 317.0  361  317.0     No results found for: LIPASE    Latest Ref Rng & Units 09/12/2023    9:13 AM 09/27/2022   10:25 AM 03/21/2021    9:12 AM  CMP  Glucose 70 - 99 mg/dL 97  81    BUN 6 - 23 mg/dL 20  23    Creatinine 9.59 - 1.20 mg/dL 9.24  9.23    Sodium 864 - 145 mEq/L 141  140    Potassium 3.5 - 5.1 mEq/L 4.8  4.1    Chloride 96 - 112 mEq/L 103  103    CO2 19 - 32 mEq/L 31  30    Calcium  8.4 -  10.5 mg/dL 89.9  89.7    Total Protein 6.0 - 8.3 g/dL 7.5  7.7  7.4   Total Bilirubin 0.2 - 1.2 mg/dL 0.7  0.8  0.7   Alkaline Phos 39 - 117 U/L 85  79  86   AST 0 - 37 U/L 19  21  18    ALT 0 - 35 U/L 18  20  18     Lab Results  Component Value Date   ESRSEDRATE 3 09/12/2023   Lab Results  Component Value Date   CRP <1.0 09/12/2023   Lab Results  Component Value Date   TSH 1.88 09/12/2023   FREET4 0.80 09/12/2023    Imaging Studies  RUQ ultrasound 11/05/2023 1. Multiple echogenic liver masses are predominantly similar in appearance compared to prior examinations dating back to 06/27/2021, favored to be hemangiomas. 2. Complex cyst of the LEFT hepatic lobe also similar in appearance compared to prior exam from 06/27/2021, which indicates a benign etiology.  RUQ ultrasound 04/22/2023 1. Similar-appearing echogenic lesions within the left hepatic lobe. These are favored to represent hemangiomas. Recommend follow-up ultrasound in 6 months to ensure stability. 2. There is a 1 cm echogenic mass within the right hepatic lobe, not measured on prior exams, also likely hemangioma. Recommend attention on follow-up. 3. Stable 3 mm gallbladder polyp. No imaging follow-up needed.  AUS 03/14/2021 1. Mild fatty liver. 2. A focus of increased echogenicity and a complex/septated cyst within the liver as above. Follow-up with ultrasound in 3 months recommended. 3. A 4 mm gallbladder  polyp.     GI Procedures and Studies  Colonoscopy 11/22/2017 Lymphoid aggregate -no true polyp material  Colonoscopy 12/09/2006 External hemorrhoids  Clinical Impression  It is my clinical impression that Ms. Dehaan is a 68 y.o. female with;  Change in bowel habits-mucus in stool Liver hemangiomas and cyst Gallbladder polyp  Ms. Alfred presents to the office today with a primary concern regarding change in bowel habits and mucus in her stool.  She relates receiving several courses of antibiotics over the last year for UTI as well as a skin burn.  In July 2025 she began to note the presence of white mucoid discharge in her stool.  This was previously occurring on almost a daily basis but is now decreasing in frequency.  No other changes in her bowels-no diarrhea, constipation, melena, hematochezia or abdominal pain.  Reviewed that the presence of mucoid discharge is most likely benign but we will perform stool studies to rule out inflammation and chronic infection.  Previous imaging has shown evidence of liver hemangiomas as well as a complex cyst.  These have been stable on imaging between 2023 and 2025 and are therefore felt to be benign.  Additionally she has been noted to have a small gallbladder polyp measuring 3 to 4 mm that has generally been stable.  Discussed the current guidelines would recommend 1 additional follow-up at a 5-year interval from initial diagnosis which would be 2028.  Plan  Stool studies today: Fecal calprotectin and ova and parasite Advised to notify our office if she develops alarm features of diarrhea, blood in the stool, abdominal pain as that would prompt further endoscopic evaluation No further imaging necessary for follow-up of liver hemangiomas and liver cyst Obtain repeat right upper quadrant ultrasound in 2028 to follow gallbladder polyp-if stable no further imaging necessary Next screening colonoscopy due 2029  Planned Follow Up PRN  The patient or  caregiver verbalized understanding of the material covered, with no  barriers to understanding. All questions were answered. Patient or caregiver is agreeable with the plan outlined above.    It was a pleasure to see Jenkins Bristle.  If you have any questions or concerns regarding this evaluation, do not hesitate to contact me.  Inocente Hausen, MD [facility]   I spent total of 45 minutes in both face-to-face (25 minutes interview) and non-face-to-face (20 minutes chart review, care coordination, documentation)  activities, excluding procedures performed, for the visit on the date of this encounter.  "

## 2023-11-19 ENCOUNTER — Encounter: Payer: Self-pay | Admitting: Pediatrics

## 2023-11-19 ENCOUNTER — Ambulatory Visit (INDEPENDENT_AMBULATORY_CARE_PROVIDER_SITE_OTHER): Admitting: Pediatrics

## 2023-11-19 VITALS — BP 122/78 | HR 81 | Ht 65.0 in | Wt 140.1 lb

## 2023-11-19 DIAGNOSIS — K7689 Other specified diseases of liver: Secondary | ICD-10-CM | POA: Diagnosis not present

## 2023-11-19 DIAGNOSIS — R194 Change in bowel habit: Secondary | ICD-10-CM

## 2023-11-19 DIAGNOSIS — K824 Cholesterolosis of gallbladder: Secondary | ICD-10-CM | POA: Diagnosis not present

## 2023-11-19 NOTE — Patient Instructions (Signed)
 Your provider has requested that you go to the basement level for lab work before leaving today. Press B on the elevator. The lab is located at the first door on the left as you exit the elevator.  Due to recent changes in healthcare laws, you may see the results of your imaging and laboratory studies on MyChart before your provider has had a chance to review them.  We understand that in some cases there may be results that are confusing or concerning to you. Not all laboratory results come back in the same time frame and the provider may be waiting for multiple results in order to interpret others.  Please give us  48 hours in order for your provider to thoroughly review all the results before contacting the office for clarification of your results.    Follow up as needed.  Thank you for entrusting me with your care and for choosing [facility], Dr. Inocente Hausen  _______________________________________________________  If your blood pressure at your visit was 140/90 or greater, please contact your primary care physician to follow up on this.  _______________________________________________________  If you are age 67 or older, your body mass index should be between 23-30. Your Body mass index is 23.32 kg/m. If this is out of the aforementioned range listed, please consider follow up with your Primary Care Provider.  If you are age 10 or younger, your body mass index should be between 19-25. Your Body mass index is 23.32 kg/m. If this is out of the aformentioned range listed, please consider follow up with your Primary Care Provider.   ________________________________________________________  The  GI providers would like to encourage you to use MYCHART to communicate with providers for non-urgent requests or questions.  Due to long hold times on the telephone, sending your provider a message by [facility] may be a faster and more efficient way to get a response.  Please allow 48  business hours for a response.  Please remember that this is for non-urgent requests.  _______________________________________________________  [facility] is using a team-based approach to care.  Your team is made up of your doctor and two to three APPS. Our APPS (Nurse Practitioners and Physician Assistants) work with your physician to ensure care continuity for you. They are fully qualified to address your health concerns and develop a treatment plan. They communicate directly with your gastroenterologist to care for you. Seeing the Advanced Practice Practitioners on your physician's team can help you by facilitating care more promptly, often allowing for earlier appointments, access to diagnostic testing, procedures, and other specialty referrals.

## 2023-11-20 ENCOUNTER — Other Ambulatory Visit

## 2023-11-20 ENCOUNTER — Other Ambulatory Visit: Payer: Self-pay

## 2023-11-20 DIAGNOSIS — R194 Change in bowel habit: Secondary | ICD-10-CM

## 2023-11-22 ENCOUNTER — Ambulatory Visit: Payer: Self-pay | Admitting: Pediatrics

## 2023-11-22 DIAGNOSIS — R194 Change in bowel habit: Secondary | ICD-10-CM

## 2023-11-22 DIAGNOSIS — R195 Other fecal abnormalities: Secondary | ICD-10-CM

## 2023-11-22 LAB — CALPROTECTIN, FECAL: Calprotectin, Fecal: 225 ug/g — ABNORMAL HIGH (ref 0–120)

## 2023-11-27 LAB — OVA AND PARASITE EXAMINATION
CONCENTRATE RESULT:: NONE SEEN
MICRO NUMBER:: 17133111
SPECIMEN QUALITY:: ADEQUATE
TRICHROME RESULT:: NONE SEEN

## 2023-11-28 MED ORDER — NA SULFATE-K SULFATE-MG SULF 17.5-3.13-1.6 GM/177ML PO SOLN: 1.0000 | Freq: Once | ORAL | 0 refills | Status: AC

## 2023-11-28 NOTE — Telephone Encounter (Signed)
 SUPREP prescription sent to pharmacy.   Colonoscopy prep instructions sent to patient via MyChart letter.  Ambulatory referral to GI in epic.

## 2023-12-02 ENCOUNTER — Encounter: Payer: Self-pay | Admitting: Radiology

## 2023-12-03 ENCOUNTER — Encounter: Payer: Self-pay | Admitting: Pediatrics

## 2023-12-08 NOTE — Progress Notes (Unsigned)
 [facility] History and Physical   Primary Care Physician:  Charlett Apolinar POUR, MD   Reason for Procedure:  Change in bowel habits, mucus in stool, elevated fecal calprotectin 225, rectal bleeding  Plan:    Colonoscopy   The patient was provided an opportunity to ask questions and all were answered. The patient agreed with the plan.   HPI: Bridget Elliott is a 68 y.o. female undergoing colonoscopy for investigation of change in bowel habits, mucus in stool, elevated fecal calprotectin to 25 and rectal bleeding.  Patient reports aforementioned symptoms after receiving antibiotics for UTI and skin burn.  No diarrhea, constipation, melena or hematochezia.  Laboratory evaluation disclosed an elevated fecal calprotectin.  Last colonoscopy performed in 2019 was overall normal and only showed a lymphoid aggregate that was removed.  No family history of gastrointestinal disorders.  Family history of colorectal cancer or polyps.  Father had lung and esophageal cancer.   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   Qualifier: History of  By: Nickola CMA, [place]     Hx of varicella    Renal stone    hx of removal and lithotrypsy    Scarlet fever 03/21/2007   Qualifier: History of  By: Nickola CMA, [place]     UTI (urinary tract infection) 10/22/2011    Past Surgical History:  Procedure Laterality Date   COLONOSCOPY  1841   kidney stone removal     left calf surgery     x2     TONSILLECTOMY      Prior to Admission medications   Medication Sig Start Date End Date Taking? Authorizing Provider  CALCIUM  CITRATE PO Take by mouth.    [provider]  Elderberry 575 MG/5ML SYRP Take by mouth.    [provider]  Multiple Vitamins-Minerals (EMERGEN-C IMMUNE) PACK Take by mouth daily.    [provider]  Omega-3 Fatty Acids (FISH OIL) 1000 MG CAPS 1 capsule.    [provider]  Probiotic Product (PROBIOTIC DAILY PO) Take by mouth.     [provider]  valACYclovir  (VALTREX ) 1000 MG tablet Take 2 tablets (2,000 mg total) by mouth 2 (two) times daily. As needed 10/02/23   Panosh, Wanda K, MD    Current Outpatient Medications  Medication Sig Dispense Refill   CALCIUM  CITRATE PO Take by mouth.     Elderberry 575 MG/5ML SYRP Take by mouth.     Multiple Vitamins-Minerals (EMERGEN-C IMMUNE) PACK Take by mouth daily.     Omega-3 Fatty Acids (FISH OIL) 1000 MG CAPS 1 capsule.     Probiotic Product (PROBIOTIC DAILY PO) Take by mouth.     valACYclovir  (VALTREX ) 1000 MG tablet Take 2 tablets (2,000 mg total) by mouth 2 (two) times daily. As needed 30 tablet 5   No current facility-administered medications for this visit.    Allergies as of 12/10/2023   (No Known Allergies)    Family History  Problem Relation Age of Onset   Cancer Mother    Hypertension Mother    Hyperlipidemia Mother    Cancer Father        Lung,esophageal   Esophageal cancer Father    Heart attack Maternal Grandfather    Heart attack Paternal Grandfather    Colon cancer Neg Hx    Colon polyps Neg Hx    Rectal cancer Neg Hx    Stomach cancer Neg Hx    Pancreatic cancer Neg Hx     Social  History   Socioeconomic History   Marital status: Married    Spouse name: Not on file   Number of children: Not on file   Years of education: Not on file   Highest education level: Bachelor's degree (e.g., BA, AB, BS)  Occupational History   Not on file  Tobacco Use   Smoking status: Never   Smokeless tobacco: Never  Vaping Use   Vaping status: Never Used  Substance and Sexual Activity   Alcohol use: Yes    Alcohol/week: 0.0 standard drinks of alcohol    Comment: 5 glasses of wine a week   Drug use: No   Sexual activity: Yes    Partners: Male  Other Topics Concern   Not on file  Social History Narrative   7 hours of sleep per night   Does not work outside the home   Lives alone with her husband   2 Cats in the home   G4P4   LIFESTYLE:     Exercise:     Tobacco/ETS:no   Alcohol: per day  0 - 5 per week    Sugar beverages:   Sleep: 7 hours    Drug use: no   Social Drivers of Corporate Investment Banker Strain: Low Risk  (01/21/2023)   Overall Financial Resource Strain (CARDIA)    Difficulty of Paying Living Expenses: Not hard at all  Food Insecurity: No Food Insecurity (09/11/2023)   Hunger Vital Sign    Worried About Running Out of Food in the Last Year: Never true    Ran Out of Food in the Last Year: Never true  Transportation Needs: No Transportation Needs (09/11/2023)   PRAPARE - Administrator, Civil Service (Medical): No    Lack of Transportation (Non-Medical): No  Physical Activity: Sufficiently Active (09/11/2023)   Exercise Vital Sign    Days of Exercise per Week: 6 days    Minutes of Exercise per Session: 60 min  Stress: No Stress Concern Present (09/11/2023)   [employer] of Occupational Health - Occupational Stress Questionnaire    Feeling of Stress: Not at all  Social Connections: Moderately Integrated (09/11/2023)   Social Connection and Isolation Panel    Frequency of Communication with Friends and Family: More than three times a week    Frequency of Social Gatherings with Friends and Family: Twice a week    Attends Religious Services: 1 to 4 times per year    Active Member of [employer] or Organizations: No    Attends Engineer, Structural: Not on file    Marital Status: Married  Catering Manager Violence: Not At Risk (09/03/2022)   Humiliation, Afraid, Rape, and Kick questionnaire    Fear of Current or Ex-Partner: No    Emotionally Abused: No    Physically Abused: No    Sexually Abused: No    Review of Systems:  All other review of systems negative except as mentioned in the HPI.  Physical Exam: Vital signs There were no vitals taken for this visit.  General:   Alert,  Well-developed, well-nourished, pleasant and cooperative in NAD Airway:  Mallampati  Lungs:  Clear  throughout to auscultation.   Heart:  Regular rate and rhythm; no murmurs, clicks, rubs,  or gallops. Abdomen:  Soft, nontender and nondistended. Normal bowel sounds.   Neuro/Psych:  Normal mood and affect. A and O x 3  Inocente Hausen, MD [facility]

## 2023-12-10 ENCOUNTER — Encounter: Payer: Self-pay | Admitting: Pediatrics

## 2023-12-10 ENCOUNTER — Ambulatory Visit (AMBULATORY_SURGERY_CENTER): Admitting: Pediatrics

## 2023-12-10 VITALS — BP 137/75 | HR 78 | Temp 97.9°F | Resp 21 | Ht 65.0 in | Wt 140.0 lb

## 2023-12-10 DIAGNOSIS — D128 Benign neoplasm of rectum: Secondary | ICD-10-CM

## 2023-12-10 DIAGNOSIS — K644 Residual hemorrhoidal skin tags: Secondary | ICD-10-CM

## 2023-12-10 DIAGNOSIS — K5730 Diverticulosis of large intestine without perforation or abscess without bleeding: Secondary | ICD-10-CM | POA: Diagnosis not present

## 2023-12-10 DIAGNOSIS — K648 Other hemorrhoids: Secondary | ICD-10-CM

## 2023-12-10 DIAGNOSIS — K6289 Other specified diseases of anus and rectum: Secondary | ICD-10-CM

## 2023-12-10 DIAGNOSIS — R194 Change in bowel habit: Secondary | ICD-10-CM | POA: Diagnosis not present

## 2023-12-10 DIAGNOSIS — K635 Polyp of colon: Secondary | ICD-10-CM

## 2023-12-10 DIAGNOSIS — R195 Other fecal abnormalities: Secondary | ICD-10-CM

## 2023-12-10 DIAGNOSIS — D125 Benign neoplasm of sigmoid colon: Secondary | ICD-10-CM

## 2023-12-10 DIAGNOSIS — K529 Noninfective gastroenteritis and colitis, unspecified: Secondary | ICD-10-CM

## 2023-12-10 DIAGNOSIS — D122 Benign neoplasm of ascending colon: Secondary | ICD-10-CM

## 2023-12-10 MED ORDER — SODIUM CHLORIDE 0.9 % IV SOLN: 500.0000 mL | Freq: Once | INTRAVENOUS | Status: DC

## 2023-12-10 MED ORDER — MESALAMINE 1000 MG RE SUPP: RECTAL | 3 refills | Status: AC

## 2023-12-10 NOTE — Progress Notes (Signed)
 Transferred to PACU via stretcher.  Not responding to stimulation at this time.  VSS upon leaving procedure room.

## 2023-12-10 NOTE — Op Note (Signed)
 [facility] Patient Name: Bridget Elliott Procedure Date: 12/10/2023 7:48 AM MRN: [id number] Endoscopist: Inocente Hausen , MD, [id number] Age: 68 Referring MD:  Date of Birth: 17-Dec-1955 Gender: Female Account #: [id number] Procedure:                Colonoscopy Indications:              Last colonoscopy: October 2019, Change in bowel                            habits -mucus in stool, elevated fecal calprotectin                            225 Medicines:                Monitored Anesthesia Care Procedure:                Pre-Anesthesia Assessment:                           - Prior to the procedure, a History and Physical                            was performed, and patient medications and                            allergies were reviewed. The patient's tolerance of                            previous anesthesia was also reviewed. The risks                            and benefits of the procedure and the sedation                            options and risks were discussed with the patient.                            All questions were answered, and informed consent                            was obtained. Prior Anticoagulants: The patient has                            taken no anticoagulant or antiplatelet agents. ASA                            Grade Assessment: I - A normal, healthy patient.                            After reviewing the risks and benefits, the patient                            was deemed in satisfactory condition to undergo the  procedure.                           After obtaining informed consent, the colonoscope                            was passed under direct vision. Throughout the                            procedure, the patient's blood pressure, pulse, and                            oxygen saturations were monitored continuously. The                            [id number]-HQ190L Colonoscope [id number] was introduced                             through the anus and advanced to the terminal                            ileum. The colonoscopy was performed without                            difficulty. The patient tolerated the procedure                            well. The quality of the bowel preparation was                            good. The terminal ileum, ileocecal valve,                            appendiceal orifice, and rectum were photographed. Scope In: 8:02:41 AM Scope Out: 8:22:33 AM Scope Withdrawal Time: 0 hours 14 minutes 10 seconds  Total Procedure Duration: 0 hours 19 minutes 52 seconds  Findings:                 Hemorrhoids were found on perianal exam.                           The digital rectal exam was normal. Pertinent                            negatives include normal sphincter tone and no                            palpable rectal lesions.                           Inflammation was found in the colon involving the                            distal 3 cm of the rectum. This was graded as [employer]  Score 1 (mild, with erythema, decreased vascular                            pattern, mild friability). Biopsies were taken with                            a cold forceps for histology.                           The proximal rectum, mid rectum, sigmoid colon,                            descending colon, transverse colon and ascending                            colon appeared normal. Biopsies were taken with a                            cold forceps for histology.                           A cecal red patch was present                           Three sessile polyps were found in the rectum,                            sigmoid colon and ascending colon. The polyps were                            3 to 4 mm in size. These polyps were removed with a                            cold biopsy forceps. Resection and retrieval were                            complete.                            Multiple small-mouthed diverticula were found in                            the sigmoid colon, transverse colon and ascending                            colon.                           The terminal ileum appeared normal.                           Internal hemorrhoids were found during retroflexion. Complications:            No immediate complications. Estimated blood loss:  Minimal. Estimated Blood Loss:     Estimated blood loss was minimal. Impression:               - Hemorrhoids found on perianal exam.                           - Mild (Mayo Score 1) proctitis involving the                            distal 3 cm of the rectum. Biopsied. A cecal red                            patch was present. 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                           - The sigmoid colon, descending colon, transverse                            colon, ascending colon, proximal rectum and mid                            rectum are normal. Biopsied.                           - Three 3 to 4 mm polyps in the rectum, in the                            sigmoid colon and in the ascending colon, removed                            with a cold biopsy forceps. Resected and retrieved.                           - Diverticulosis in the sigmoid colon, in the                            transverse colon and in the ascending colon.                           - The examined portion of the ileum was normal.                           - Internal hemorrhoids. Recommendation:           - Discharge patient to home (ambulatory).                           - Await pathology results.                           - Start Canasa suppositories 1 g per rectum nightly                           -  Repeat colonoscopy for surveillance based on                            pathology results.                           - Return to GI clinic in 4 - 6 weeks  with Dr.                            Suzann or APP Oscar Coombs or [facility]).                           - The findings and recommendations were discussed                            with the patient's family.                           - Patient has a contact number available for                            emergencies. The signs and symptoms of potential                            delayed complications were discussed with the                            patient. Return to normal activities tomorrow.                            Written discharge instructions were provided to the                            patient. Inocente Suzann, MD 12/10/2023 8:31:05 AM This report has been signed electronically.

## 2023-12-10 NOTE — Progress Notes (Signed)
 Called to room to assist during endoscopic procedure.  Patient ID and intended procedure confirmed with present staff. Received instructions for my participation in the procedure from the performing physician.

## 2023-12-10 NOTE — Patient Instructions (Addendum)
 Resume previous diet. Continue present medications. Awaiting pathology results. Start Canasa suppositories 1 g per rectum nightly. Repeat colonoscopy for surveillance based on pathology results. Handouts provided on polyps, diverticulosis, and hemorrhoids.   YOU HAD AN ENDOSCOPIC PROCEDURE TODAY AT THE [facility]:   Refer to the procedure report that was given to you for any specific questions about what was found during the examination.  If the procedure report does not answer your questions, please call your gastroenterologist to clarify.  If you requested that your care partner not be given the details of your procedure findings, then the procedure report has been included in a sealed envelope for you to review at your convenience later.  YOU SHOULD EXPECT: Some feelings of bloating in the abdomen. Passage of more gas than usual.  Walking can help get rid of the air that was put into your GI tract during the procedure and reduce the bloating. If you had a lower endoscopy (such as a colonoscopy or flexible sigmoidoscopy) you may notice spotting of blood in your stool or on the toilet paper. If you underwent a bowel prep for your procedure, you may not have a normal bowel movement for a few days.  Please Note:  You might notice some irritation and congestion in your nose or some drainage.  This is from the oxygen used during your procedure.  There is no need for concern and it should clear up in a day or so.  SYMPTOMS TO REPORT IMMEDIATELY:  Following lower endoscopy (colonoscopy or flexible sigmoidoscopy):  Excessive amounts of blood in the stool  Significant tenderness or worsening of abdominal pains  Swelling of the abdomen that is new, acute  Fever of 100F or higher  For urgent or emergent issues, a gastroenterologist can be reached at any hour by calling [phone]. Do not use MyChart messaging for urgent concerns.    DIET:  We do recommend a small meal at first, but  then you may proceed to your regular diet.  Drink plenty of fluids but you should avoid alcoholic beverages for 24 hours.  ACTIVITY:  You should plan to take it easy for the rest of today and you should NOT DRIVE or use heavy machinery until tomorrow (because of the sedation medicines used during the test).    FOLLOW UP: Our staff will call the number listed on your records the next business day following your procedure.  We will call around 7:15- 8:00 am to check on you and address any questions or concerns that you may have regarding the information given to you following your procedure. If we do not reach you, we will leave a message.     If any biopsies were taken you will be contacted by phone or by letter within the next 1-3 weeks.  Please call us  at [phone] if you have not heard about the biopsies in 3 weeks.    SIGNATURES/CONFIDENTIALITY: You and/or your care partner have signed paperwork which will be entered into your electronic medical record.  These signatures attest to the fact that that the information above on your After Visit Summary has been reviewed and is understood.  Full responsibility of the confidentiality of this discharge information lies with you and/or your care-partner.

## 2023-12-10 NOTE — Progress Notes (Signed)
 Pt's states no medical or surgical changes since previsit or office visit.

## 2023-12-11 ENCOUNTER — Telehealth: Payer: Self-pay

## 2023-12-11 NOTE — Telephone Encounter (Signed)
  Follow up Call-     12/10/2023    7:14 AM  Call back number  Post procedure Call Back phone  # [phone]  Permission to leave phone message Yes     Patient questions:  Do you have a fever, pain , or abdominal swelling? No. Pain Score  0 *  Have you tolerated food without any problems? Yes.    Have you been able to return to your normal activities? Yes.    Do you have any questions about your discharge instructions: Diet   No. Medications  No. Follow up visit  No.  Do you have questions or concerns about your Care? No.  Actions: * If pain score is 4 or above: No action needed, pain <4.

## 2023-12-13 ENCOUNTER — Ambulatory Visit: Payer: Self-pay | Admitting: Pediatrics

## 2023-12-13 LAB — SURGICAL PATHOLOGY

## 2024-01-12 NOTE — Progress Notes (Unsigned)
 [facility] return visit   Referring Provider Panosh, Apolinar POUR, MD [address] [place],  [place] [address]  Primary Care Provider Panosh, Apolinar POUR, MD  Patient Profile: Bridget Elliott is a 68 y.o. female who returns to the [facility] for follow-up of of the problem(s) noted below.  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  History of Present Illness Bridget Elliott is a 68 year old female with a past medical history noteworthy for anemia, nephrolithiasis, UTI who was referred to the gastroenterology office for evaluation of change in bowel habits -mucus in stool, history of liver hemangioma and cyst as well as gallbladder polyp   Altered bowel habits and mucus in stool - Reports frequent antibiotic use over the last year for UTI as well as a skin burn -possible disruption to gut microbiome - Since July 2025, changes in bowel movements with the appearance of white mucus after multiple courses of antibiotics - Initially, mucus present with every bowel movement; currently intermittent, occurring two to three times per week - Bowel movement frequency remains daily in the morning with some fluctuation in consistency - No diarrhea or significant gastrointestinal symptoms following antibiotic use - Occasional rectal bleeding/blood blood on wiping attributed to an external hemorrhoid - Intermittent pressure in the anorectal area - No significant abdominal pain  - Long-term probiotic use without changes - Denies dietary modifications - Diet includes oatmeal and prunes - Takes a multivitamin, calcium  supplement, and immune boosters as needed  - Last colonoscopy performed 2019 -lymphoid aggregate-next due 2029  Liver hemangiomas and  cysts - Has undergone repeated ultrasound and CT monitoring for liver lesions characterize as hemangiomas and a complex cyst - Last RUQ ultrasound 10/2023 confirm stability of angiomas and complex cyst -no changes since 2023 - Reports that her father had a form of liver cyst and underwent surgical resection in [place]  in remote past - Otherwise, no family history of significant liver disease or malignancy  Gallbladder polyp - Ultrasound imaging has shown evidence of a diminutive gallbladder polyp measured 3 to 4 mm in 2023 and 2025 - Reviewed the size and stability suggest that this is benign -guidelines would advise a 5-year follow-up ultrasound in 2028  Colorectal cancer screening - Up-to-date for colorectal cancer screening -documentation of prior colonoscopies under review of data - No history of colon polyps - No family history of colorectal cancer   GI Review of Symptoms Significant for mucus in stool. Otherwise negative.  General Review of Systems  Review of systems is significant for the pertinent positives and negatives as listed per the HPI.  Full ROS is otherwise negative.  Inflammatory Bowel Disease History  ***Bullet point chronologic history***  IBD Medication History  Past Medical History   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   Qualifier: History of  By: Nickola CMA, [place]     Hx of varicella    Renal stone    hx of removal and lithotrypsy    Scarlet fever 03/21/2007   Qualifier: History of  By: Nickola CMA, [place]     UTI (urinary tract infection) 10/22/2011     Past Surgical History   Past Surgical History:  Procedure Laterality Date   COLONOSCOPY  1841   kidney stone removal     left  calf surgery     x2     TONSILLECTOMY       Allergies and Medications   No Known Allergies   No outpatient medications have been marked as taking for the 01/13/24 encounter (Appointment) with Keeven Matty, Inocente HERO, MD.     Family History    Family History  Problem Relation Age of Onset   Cancer Mother    Hypertension Mother    Hyperlipidemia Mother    Cancer Father        Lung,esophageal   Esophageal cancer Father    Heart attack Maternal Grandfather    Heart attack Paternal Grandfather    Colon cancer Neg Hx    Colon polyps Neg Hx    Rectal cancer Neg Hx    Stomach cancer Neg Hx    Pancreatic cancer Neg Hx      Social History   Social History   Tobacco Use   Smoking status: Never   Smokeless tobacco: Never  Vaping Use   Vaping status: Never Used  Substance Use Topics   Alcohol use: Yes    Alcohol/week: 0.0 standard drinks of alcohol    Comment: 5 glasses of wine a week   Drug use: No   [name] reports that she has never smoked. She has never used smokeless tobacco. She reports current alcohol use. She reports that she does not use drugs.  Vital Signs and Physical Examination   There were no vitals filed for this visit.  There is no height or weight on file to calculate BMI.    General: Well developed, well nourished, no acute distress Head: Normocephalic and atraumatic Eyes: Sclerae anicteric, EOMI Lungs: Clear throughout to auscultation Heart: Regular rate and rhythm; No murmurs, rubs or bruits Abdomen: Soft, non tender and non distended. No masses, hepatosplenomegaly or hernias noted. Normal Bowel sounds Rectal: Deferred Musculoskeletal: Symmetrical with no gross deformities    Review of Data  The following data was reviewed at the time of this encounter:  Laboratory Studies      Latest Ref Rng & Units 09/12/2023    9:13 AM 02/11/2023   10:47 AM 09/27/2022   10:25 AM  CBC  WBC 4.0 - 10.5 K/uL 6.0  6.6  6.6   Hemoglobin 12.0 - 15.0 g/dL 84.6  84.9  84.4   Hematocrit 36.0 - 46.0 % 45.5  45.3  47.4   Platelets 150.0 - 400.0 K/uL 317.0  361  317.0     No results found for: LIPASE    Latest Ref Rng & Units 09/12/2023    9:13 AM 09/27/2022   10:25 AM 03/21/2021    9:12 AM  CMP   Glucose 70 - 99 mg/dL 97  81    BUN 6 - 23 mg/dL 20  23    Creatinine 9.59 - 1.20 mg/dL 9.24  9.23    Sodium 864 - 145 mEq/L 141  140    Potassium 3.5 - 5.1 mEq/L 4.8  4.1    Chloride 96 - 112 mEq/L 103  103    CO2 19 - 32 mEq/L 31  30    Calcium  8.4 - 10.5 mg/dL 89.9  89.7    Total Protein 6.0 - 8.3 g/dL 7.5  7.7  7.4   Total Bilirubin 0.2 - 1.2 mg/dL 0.7  0.8  0.7   Alkaline Phos 39 - 117 U/L 85  79  86   AST 0 - 37 U/L 19  21  18    ALT  0 - 35 U/L 18  20  18     Lab Results  Component Value Date   ESRSEDRATE 3 09/12/2023   Lab Results  Component Value Date   CRP <1.0 09/12/2023   Lab Results  Component Value Date   TSH 1.88 09/12/2023   FREET4 0.80 09/12/2023   IBD Labs  Prebiologic Labs @[facility]  Therapeutic Drug Monitoring @[facility] (TPMTACT)@ Thiopurine metabolite levels:  Date:                6-TGN***       6-MMP***  Biologic level and antibodies:***  Fecal Calprotectin @[facility] (FECALCAL)@  Imaging Studies  RUQ ultrasound 11/05/2023 1. Multiple echogenic liver masses are predominantly similar in appearance compared to prior examinations dating back to 06/27/2021, favored to be hemangiomas. 2. Complex cyst of the LEFT hepatic lobe also similar in appearance compared to prior exam from 06/27/2021, which indicates a benign etiology.  RUQ ultrasound 04/22/2023 1. Similar-appearing echogenic lesions within the left hepatic lobe. These are favored to represent hemangiomas. Recommend follow-up ultrasound in 6 months to ensure stability. 2. There is a 1 cm echogenic mass within the right hepatic lobe, not measured on prior exams, also likely hemangioma. Recommend attention on follow-up. 3. Stable 3 mm gallbladder polyp. No imaging follow-up needed.  AUS 03/14/2021 1. Mild fatty liver. 2. A focus of increased echogenicity and a complex/septated cyst within the liver as above. Follow-up with ultrasound in 3  months recommended. 3. A 4 mm gallbladder polyp.     GI Procedures and Studies  Colonoscopy 12/10/2023 [facility] 1 inflammation involving distal 3 cm of rectum; remainder of colon devoid of inflammation; normal TI Three 3-4 mm sessile polyps Diverticula in sigmoid, transverse and descending colon Internal and external hemorrhoids  Path: Chronic active colitis with mild to moderate activity in the rectum; normal right and left colon biopsies           Hyperplastic polyps  Colonoscopy 11/22/2017 Lymphoid aggregate -no true polyp material  Colonoscopy 12/09/2006 External hemorrhoids  Clinical Impression  It is my clinical impression that Bridget Elliott is a 68 y.o. 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  She relates receiving several courses of antibiotics over the last year for UTI as well as a skin burn.  In July 2025 she began to note the presence of white mucoid discharge in her stool.  This was previously occurring on almost a daily basis but is now decreasing in frequency.  No other changes in her bowels-no diarrhea, constipation, melena, hematochezia or abdominal pain.  Reviewed that the presence of mucoid discharge is most likely benign but we will perform stool studies to rule out inflammation and chronic infection.  Previous imaging has shown evidence of liver hemangiomas as well as a complex cyst.  These have been stable on imaging between 2023 and 2025 and are therefore felt to be benign.  Additionally she has been noted to have a small gallbladder polyp measuring 3 to 4 mm that has generally been stable.  Discussed the current guidelines would recommend 1 additional follow-up at a 5-year interval from initial diagnosis which would be 2028.  Plan  Stool studies today: Fecal calprotectin and ova and  parasite Advised to notify our office if she develops alarm features of diarrhea, blood in the stool, abdominal pain as that would prompt further endoscopic  evaluation No further imaging necessary for follow-up of liver hemangiomas and liver cyst Obtain repeat right upper quadrant ultrasound in 2028 to follow gallbladder polyp-if stable no further imaging necessary Next screening colonoscopy due 2029  IBD Health Maintenance  Vaccinations Influenza: PCV 20: PCV 21: PPSV23: COVID19: HAV: HBV:  Shingles: HPV: Tdap:  DEXA   Pap Smear   Eye Exam   Skin Exam   Surveillance Colonoscopy   Tobacco Use   Depression Screen    Planned Follow Up PRN  The patient or caregiver verbalized understanding of the material covered, with no barriers to understanding. All questions were answered. Patient or caregiver is agreeable with the plan outlined above.    It was a pleasure to see Bridget Elliott.  If you have any questions or concerns regarding this evaluation, do not hesitate to contact me.  Inocente Hausen, MD [facility]   I spent total of 45 minutes in both face-to-face (25 minutes interview) and non-face-to-face (20 minutes chart review, care coordination, documentation)  activities, excluding procedures performed, for the visit on the date of this encounter.

## 2024-01-13 ENCOUNTER — Encounter: Payer: Self-pay | Admitting: Pediatrics

## 2024-01-13 ENCOUNTER — Ambulatory Visit: Admitting: Pediatrics

## 2024-01-13 VITALS — BP 140/78 | HR 87 | Ht 65.0 in | Wt 140.2 lb

## 2024-01-13 DIAGNOSIS — K51919 Ulcerative colitis, unspecified with unspecified complications: Secondary | ICD-10-CM | POA: Diagnosis not present

## 2024-01-13 DIAGNOSIS — K5120 Ulcerative (chronic) proctitis without complications: Secondary | ICD-10-CM | POA: Diagnosis not present

## 2024-01-13 NOTE — Patient Instructions (Signed)
 Your provider has requested that you go to the basement level for lab work before leaving today. Press B on the elevator. The lab is located at the first door on the left as you exit the elevator.  Due to recent changes in healthcare laws, you may see the results of your imaging and laboratory studies on MyChart before your provider has had a chance to review them.  We understand that in some cases there may be results that are confusing or concerning to you. Not all laboratory results come back in the same time frame and the provider may be waiting for multiple results in order to interpret others.  Please give us  48 hours in order for your provider to thoroughly review all the results before contacting the office for clarification of your results.   Follow up in 6 months.  Thank you for entrusting me with your care and for choosing [facility], Dr. Inocente Hausen  _______________________________________________________  If your blood pressure at your visit was 140/90 or greater, please contact your primary care physician to follow up on this.  _______________________________________________________  If you are age 67 or older, your body mass index should be between 23-30. Your Body mass index is 23.34 kg/m. If this is out of the aforementioned range listed, please consider follow up with your Primary Care Provider.  If you are age 67 or younger, your body mass index should be between 19-25. Your Body mass index is 23.34 kg/m. If this is out of the aformentioned range listed, please consider follow up with your Primary Care Provider.   ________________________________________________________  The [facility] providers would like to encourage you to use MYCHART to communicate with providers for non-urgent requests or questions.  Due to long hold times on the telephone, sending your provider a message by [facility] may be a faster and more efficient way to get a response.  Please allow 48  business hours for a response.  Please remember that this is for non-urgent requests.  _______________________________________________________  [facility] is using a team-based approach to care.  Your team is made up of your doctor and two to three APPS. Our APPS (Nurse Practitioners and Physician Assistants) work with your physician to ensure care continuity for you. They are fully qualified to address your health concerns and develop a treatment plan. They communicate directly with your gastroenterologist to care for you. Seeing the Advanced Practice Practitioners on your physician's team can help you by facilitating care more promptly, often allowing for earlier appointments, access to diagnostic testing, procedures, and other specialty referrals.

## 2024-01-17 ENCOUNTER — Other Ambulatory Visit

## 2024-01-17 DIAGNOSIS — K51919 Ulcerative colitis, unspecified with unspecified complications: Secondary | ICD-10-CM

## 2024-01-21 ENCOUNTER — Ambulatory Visit: Payer: Self-pay | Admitting: Pediatrics

## 2024-01-21 LAB — CALPROTECTIN, FECAL: Calprotectin, Fecal: 37 ug/g (ref 0–120)

## 2024-01-22 ENCOUNTER — Ambulatory Visit

## 2024-01-22 NOTE — Progress Notes (Signed)
 FYI Pt walked in to the office this  morning requesting for BP check, pt stated that she had an office visit with her GI on 01/13/24 and her BP was 140/78, Gi Dr advised pt to see her PCP for BP recheck. Pt BP today was 124/76.
